# Patient Record
Sex: Male | Born: 1935 | Race: White | Hispanic: No | Marital: Married | Smoking: Never smoker
Health system: Southern US, Community
[De-identification: ages and names within clinical notes are randomized; demographics above are authoritative.]

## PROBLEM LIST (undated history)

## (undated) DIAGNOSIS — I2510 Atherosclerotic heart disease of native coronary artery without angina pectoris: Secondary | ICD-10-CM

## (undated) DIAGNOSIS — E785 Hyperlipidemia, unspecified: Secondary | ICD-10-CM

## (undated) DIAGNOSIS — N200 Calculus of kidney: Secondary | ICD-10-CM

## (undated) DIAGNOSIS — K219 Gastro-esophageal reflux disease without esophagitis: Secondary | ICD-10-CM

## (undated) DIAGNOSIS — E119 Type 2 diabetes mellitus without complications: Secondary | ICD-10-CM

## (undated) DIAGNOSIS — F419 Anxiety disorder, unspecified: Secondary | ICD-10-CM

## (undated) DIAGNOSIS — C61 Malignant neoplasm of prostate: Secondary | ICD-10-CM

## (undated) DIAGNOSIS — I10 Essential (primary) hypertension: Secondary | ICD-10-CM

## (undated) DIAGNOSIS — I509 Heart failure, unspecified: Secondary | ICD-10-CM

## (undated) DIAGNOSIS — D649 Anemia, unspecified: Secondary | ICD-10-CM

## (undated) HISTORY — DX: Heart failure, unspecified: I50.9

## (undated) HISTORY — DX: Atherosclerotic heart disease of native coronary artery without angina pectoris: I25.10

## (undated) HISTORY — DX: Anxiety disorder, unspecified: F41.9

## (undated) HISTORY — DX: Hyperlipidemia, unspecified: E78.5

## (undated) HISTORY — DX: Anemia, unspecified: D64.9

## (undated) HISTORY — DX: Malignant neoplasm of prostate: C61

## (undated) HISTORY — DX: Calculus of kidney: N20.0

## (undated) HISTORY — PX: OTHER SURGICAL HISTORY: SHX169

## (undated) HISTORY — DX: Type 2 diabetes mellitus without complications: E11.9

## (undated) HISTORY — DX: Gastro-esophageal reflux disease without esophagitis: K21.9

---

## 1992-10-12 HISTORY — PX: HERNIA REPAIR: SHX51

## 1998-08-30 ENCOUNTER — Other Ambulatory Visit: Admission: RE | Admit: 1998-08-30 | Discharge: 1998-08-30 | Payer: Self-pay | Admitting: Urology

## 1999-04-29 ENCOUNTER — Ambulatory Visit (HOSPITAL_BASED_OUTPATIENT_CLINIC_OR_DEPARTMENT_OTHER): Admission: RE | Admit: 1999-04-29 | Discharge: 1999-04-29 | Payer: Self-pay | Admitting: Urology

## 2000-10-12 HISTORY — PX: OTHER SURGICAL HISTORY: SHX169

## 2000-11-11 ENCOUNTER — Encounter: Payer: Self-pay | Admitting: Surgery

## 2000-11-12 ENCOUNTER — Encounter: Payer: Self-pay | Admitting: Surgery

## 2000-11-12 ENCOUNTER — Inpatient Hospital Stay (HOSPITAL_COMMUNITY): Admission: AD | Admit: 2000-11-12 | Discharge: 2000-11-17 | Payer: Self-pay | Admitting: Cardiology

## 2000-11-13 ENCOUNTER — Encounter: Payer: Self-pay | Admitting: Surgery

## 2000-11-14 ENCOUNTER — Encounter: Payer: Self-pay | Admitting: Cardiothoracic Surgery

## 2000-11-15 ENCOUNTER — Encounter: Payer: Self-pay | Admitting: Cardiothoracic Surgery

## 2000-11-16 ENCOUNTER — Encounter: Payer: Self-pay | Admitting: Surgery

## 2001-01-21 ENCOUNTER — Encounter: Payer: Self-pay | Admitting: Urology

## 2001-01-25 ENCOUNTER — Ambulatory Visit (HOSPITAL_COMMUNITY): Admission: RE | Admit: 2001-01-25 | Discharge: 2001-01-25 | Payer: Self-pay | Admitting: Urology

## 2002-02-26 ENCOUNTER — Inpatient Hospital Stay (HOSPITAL_COMMUNITY): Admission: EM | Admit: 2002-02-26 | Discharge: 2002-02-27 | Payer: Self-pay | Admitting: Emergency Medicine

## 2002-02-26 ENCOUNTER — Encounter: Payer: Self-pay | Admitting: Emergency Medicine

## 2002-09-18 ENCOUNTER — Encounter: Payer: Self-pay | Admitting: Urology

## 2002-09-18 ENCOUNTER — Encounter: Admission: RE | Admit: 2002-09-18 | Discharge: 2002-09-18 | Payer: Self-pay | Admitting: Urology

## 2002-10-12 HISTORY — PX: OTHER SURGICAL HISTORY: SHX169

## 2002-10-12 HISTORY — PX: TOTAL HIP ARTHROPLASTY: SHX124

## 2003-04-09 ENCOUNTER — Encounter: Admission: RE | Admit: 2003-04-09 | Discharge: 2003-04-09 | Payer: Self-pay | Admitting: Orthopedic Surgery

## 2003-04-09 ENCOUNTER — Encounter: Payer: Self-pay | Admitting: Orthopedic Surgery

## 2003-05-24 ENCOUNTER — Encounter: Payer: Self-pay | Admitting: Orthopedic Surgery

## 2003-05-30 ENCOUNTER — Inpatient Hospital Stay (HOSPITAL_COMMUNITY): Admission: RE | Admit: 2003-05-30 | Discharge: 2003-06-06 | Payer: Self-pay | Admitting: Orthopedic Surgery

## 2003-05-30 ENCOUNTER — Encounter: Payer: Self-pay | Admitting: Orthopedic Surgery

## 2003-08-30 ENCOUNTER — Observation Stay (HOSPITAL_COMMUNITY): Admission: RE | Admit: 2003-08-30 | Discharge: 2003-08-31 | Payer: Self-pay | Admitting: Orthopedic Surgery

## 2005-01-12 ENCOUNTER — Ambulatory Visit: Payer: Self-pay | Admitting: Internal Medicine

## 2006-09-07 ENCOUNTER — Ambulatory Visit: Payer: Self-pay | Admitting: Gastroenterology

## 2008-06-25 ENCOUNTER — Ambulatory Visit: Payer: Self-pay | Admitting: Internal Medicine

## 2008-06-25 ENCOUNTER — Encounter: Payer: Self-pay | Admitting: Cardiology

## 2008-06-25 ENCOUNTER — Inpatient Hospital Stay: Payer: Self-pay | Admitting: Internal Medicine

## 2008-06-25 ENCOUNTER — Ambulatory Visit: Payer: Self-pay | Admitting: Cardiovascular Disease

## 2008-07-24 ENCOUNTER — Ambulatory Visit: Payer: Self-pay | Admitting: Cardiology

## 2009-04-04 DIAGNOSIS — I2510 Atherosclerotic heart disease of native coronary artery without angina pectoris: Secondary | ICD-10-CM | POA: Insufficient documentation

## 2009-08-26 ENCOUNTER — Ambulatory Visit: Payer: Self-pay | Admitting: Gastroenterology

## 2009-12-28 ENCOUNTER — Emergency Department: Payer: Self-pay | Admitting: Emergency Medicine

## 2010-05-15 ENCOUNTER — Ambulatory Visit: Payer: Self-pay | Admitting: Family

## 2010-05-21 ENCOUNTER — Ambulatory Visit: Payer: Self-pay | Admitting: Gastroenterology

## 2010-05-23 LAB — PATHOLOGY REPORT

## 2010-07-18 ENCOUNTER — Ambulatory Visit: Payer: Self-pay | Admitting: Internal Medicine

## 2010-07-29 ENCOUNTER — Ambulatory Visit: Payer: Self-pay | Admitting: Internal Medicine

## 2010-11-03 ENCOUNTER — Encounter: Payer: Self-pay | Admitting: Cardiology

## 2010-11-04 ENCOUNTER — Telehealth: Payer: Self-pay | Admitting: Cardiology

## 2010-11-05 ENCOUNTER — Ambulatory Visit
Admission: RE | Admit: 2010-11-05 | Discharge: 2010-11-05 | Payer: Self-pay | Source: Home / Self Care | Attending: Cardiology | Admitting: Cardiology

## 2010-11-05 ENCOUNTER — Encounter: Payer: Self-pay | Admitting: Physician Assistant

## 2010-11-05 ENCOUNTER — Telehealth (INDEPENDENT_AMBULATORY_CARE_PROVIDER_SITE_OTHER): Payer: Self-pay | Admitting: *Deleted

## 2010-11-05 DIAGNOSIS — R079 Chest pain, unspecified: Secondary | ICD-10-CM | POA: Insufficient documentation

## 2010-11-05 DIAGNOSIS — I10 Essential (primary) hypertension: Secondary | ICD-10-CM | POA: Insufficient documentation

## 2010-11-05 DIAGNOSIS — R011 Cardiac murmur, unspecified: Secondary | ICD-10-CM | POA: Insufficient documentation

## 2010-11-05 DIAGNOSIS — E119 Type 2 diabetes mellitus without complications: Secondary | ICD-10-CM | POA: Insufficient documentation

## 2010-11-05 DIAGNOSIS — E785 Hyperlipidemia, unspecified: Secondary | ICD-10-CM | POA: Insufficient documentation

## 2010-11-06 ENCOUNTER — Ambulatory Visit (HOSPITAL_COMMUNITY)
Admission: RE | Admit: 2010-11-06 | Discharge: 2010-11-06 | Payer: Self-pay | Source: Home / Self Care | Attending: Cardiology | Admitting: Cardiology

## 2010-11-06 ENCOUNTER — Ambulatory Visit: Admission: RE | Admit: 2010-11-06 | Discharge: 2010-11-06 | Payer: Self-pay | Source: Home / Self Care

## 2010-11-06 ENCOUNTER — Encounter (HOSPITAL_COMMUNITY)
Admission: RE | Admit: 2010-11-06 | Discharge: 2010-11-11 | Payer: Self-pay | Source: Home / Self Care | Attending: Cardiology | Admitting: Cardiology

## 2010-11-06 ENCOUNTER — Encounter: Payer: Self-pay | Admitting: *Deleted

## 2010-11-06 ENCOUNTER — Encounter: Payer: Self-pay | Admitting: Cardiology

## 2010-11-07 ENCOUNTER — Telehealth: Payer: Self-pay | Admitting: Physician Assistant

## 2010-11-10 ENCOUNTER — Ambulatory Visit: Payer: Self-pay | Admitting: Gastroenterology

## 2010-11-11 ENCOUNTER — Ambulatory Visit: Admit: 2010-11-11 | Payer: Self-pay | Admitting: Cardiology

## 2010-11-13 NOTE — Progress Notes (Signed)
Summary: pt wants to be seen today   Phone Note Call from Patient Call back at Home Phone [phone] Call back at cell# [phone]   Caller: Spouse Reason for Call: Talk to Nurse, Talk to Doctor Summary of Call: pt is scheduled for an esaphugus stretching at [facility] reg. with Dr. Val Eagle on Monday 1/31 and his appt with Dr. Daleen Squibb is not til Tuesday and pt needs clearecne.[name] can not eat and gets choked sometimes and really wants to come in today if there is any way possible Initial call taken by: Omer Jack,  November 04, 2010 11:00 AM

## 2010-11-13 NOTE — Assessment & Plan Note (Addendum)
Summary: Cardiology Nuclear Testing  Nuclear Med Background Indications for Stress Test: Evaluation for Ischemia, Surgical Clearance, Graft Patency  Indications Comments: Pending esophagal dilation on 11/10/10 by Dr. Bluford Kaufmann [facility])  History: Angioplasty, CABG, Heart Catheterization, Myocardial Infarction, Myocardial Perfusion Study  History Comments: '89 MI>PTCA; '02 CABG; '05 EAV:[id number], EF=62%; '09 Cath:patent grafts  Symptoms: Chest Pressure, DOE  Symptoms Comments: Last episode of CP:3 weeks ago.   Nuclear Pre-Procedure Cardiac Risk Factors: Family History - CAD, Hypertension, Lipids, NIDDM Caffeine/Decaff Intake: none NPO After: 7:30 AM Lungs: Clear.  O2 Sat 96% on RA. IV 0.9% NS with Angio Cath: 22g     IV Site: R Hand IV Started by: Doyne Keel, CNMT Chest Size (in) 42     Height (in): 68 Weight (lb): 145 BMI: 22.13 Tech Comments: Carvedilol held this a.m.  Nuclear Med Study 1 or 2 day study:  1 day     Stress Test Type:  Eugenie Birks Reading MD:  Cassell Clement, MD     Referring MD:  Valera Castle, MD Resting Radionuclide:  Technetium 73m Tetrofosmin     Resting Radionuclide Dose:  10.9 mCi  Stress Radionuclide:  Technetium 59m Tetrofosmin     Stress Radionuclide Dose:  33 mCi   Stress Protocol   Lexiscan: 0.4 mg   Stress Test Technologist:  Rea [employer], CMA-N     Nuclear Technologist:  Domenic Polite, CNMT  Rest Procedure  Myocardial perfusion imaging was performed at rest 45 minutes following the intravenous administration of Technetium 40m Tetrofosmin.  Stress Procedure  The patient received IV Lexiscan 0.4 mg over 15-seconds.  Technetium 79m Tetrofosmin injected at 30-seconds.  There were no significant changes with infusion, other than occasional PVC's.  Quantitative spect images were obtained after a 45 minute delay.  QPS Raw Data Images:  Normal; no motion artifact; normal heart/lung ratio. Stress Images:  Normal homogeneous uptake in all areas of the  myocardium. Rest Images:  Normal homogeneous uptake in all areas of the myocardium. Subtraction (SDS):  No evidence of ischemia. Transient Ischemic Dilatation:  0.93  (Normal <1.22)  Lung/Heart Ratio:  0.32  (Normal <0.45)  Quantitative Gated Spect Images QGS EDV:  93 ml QGS ESV:  45 ml QGS EF:  52 %  Findings Normal nuclear study      Overall Impression  Exercise Capacity: Lexiscan with no exercise. BP Response: Normal blood pressure response. Clinical Symptoms: No chest pain ECG Impression: No significant ST segment change suggestive of ischemia. Overall Impression: Normal stress nuclear study. No wall motion abnormalities.  Appended Document: Cardiology Nuclear Testing normal ok to have his EGD  Appended Document: Cardiology Nuclear Testing Pt. notified.  Appended Document: Cardiology Nuclear Testing stable, chest discomfort probably noncardiac. OK to proceed with endoscopy and dilatation of stricture if necessary.  Reviewed Juanito Doom, MD  Appended Document: Cardiology Nuclear Testing normal, no change in treatment.

## 2010-11-13 NOTE — Assessment & Plan Note (Addendum)
Summary: pt having esophagus streghting on Monday 1/31 at  reg...  Medications Added ALPRAZOLAM 0.25 MG TABS (ALPRAZOLAM) 1 orally every 8 hours as needed ASPIRIN 325 MG TABS (ASPIRIN) 1 orally once a day CARVEDILOL 12.5 MG TABS (CARVEDILOL) orally twice a day FERROUS GLUCONATE 324 (38 FE) MG TABS (FERROUS GLUCONATE) 1 orally every other day METFORMIN HCL 500 MG TABS (METFORMIN HCL) 1 orally 2 times a day NITROSTAT 0.4 MG SUBL (NITROGLYCERIN) 1 sublingual as needed every 5 minutes times 3 doses if no relief call 911 PRILOSEC 40 MG CPDR (OMEPRAZOLE) 1 orally daily SIMVASTATIN 40 MG TABS (SIMVASTATIN) 2 a week at bedtime      Allergies Added: ! PENICILLIN ! * XRAY DYE  Visit Type:  Pre-op Evaluation   History of Present Illness: Primary Cardiologist:  Dr. Valera Castle   [name] is a 77 yo male with a history of CAD, status post CABG in 2002 who was last seen in the [place] office in 2009 after being admitted for chest pain.  A heart catheterization done at [facility] demonstrated a totally occluded LAD, circumflex and RCA.  His vein grafts were all patent and he had normal LV function (L-LAD, S-Dx, S-OM1&2, S-PDA).  He has not been seen since that time.  He called recently needing clearance to proceed with an EGD with possible esophageal dilatation.  He has been having progressively worsening dysphagia over the last several weeks.  From a cardiac standpoint, he has done well up until a couple of weeks ago.  He woke up with chest pain overnight.  It was a substernal pain.  He did take nitroglycerin with relief.  He denies any other associated symptoms.  He's been losing weight and has been having lots of diarrhea.  He's been unable to eat.  His strength is down.  However, he denies chest pain or shortness of breath with activity.  He can walk up and down steps or vacuum a room without angina.  He denies syncope.  He denies orthopnea, PND or pedal edema.  Current  Medications (verified): 1)  Alprazolam 0.25 Mg Tabs (Alprazolam) .Marland Kitchen.. 1 Orally Every 8 Hours As Needed 2)  Aspirin 325 Mg Tabs (Aspirin) .Marland Kitchen.. 1 Orally Once A Day 3)  Carvedilol 12.5 Mg Tabs (Carvedilol) .... Orally Twice A Day 4)  Ferrous Gluconate 324 (38 Fe) Mg Tabs (Ferrous Gluconate) .Marland Kitchen.. 1 Orally Every Other Day 5)  Metformin Hcl 500 Mg Tabs (Metformin Hcl) .Marland Kitchen.. 1 Orally 2 Times A Day 6)  Nitrostat 0.4 Mg Subl (Nitroglycerin) .Marland Kitchen.. 1 Sublingual As Needed Every 5 Minutes Times 3 Doses If No Relief Call 911 7)  Prilosec 40 Mg Cpdr (Omeprazole) .Marland Kitchen.. 1 Orally Daily 8)  Simvastatin 40 Mg Tabs (Simvastatin) .... 2 A Week At Bedtime  Allergies (verified): 1)  ! Penicillin 2)  ! * Xray Dye  Past History:  Past Medical History: CAD, UNSPECIFIED SITE (ICD-414.00) Diabetes Type 2 G E R D Hyperlipidemia Hypertension Anxiety Anemia - iron deficient   a. ? h/o GI bleed Nephrolithiasis Prostate CA  Past Surgical History: Cardiac bypass surgery in 2002, five vessel. Radiation seed implants for prostate cancer h/o esoph dilatation x 2 in past Left THR 2004 Angioplasty in 1989. Hernia repair in 1994.  Family History: Reviewed history from 04/04/2009 and no changes required. Family History of Diabetes:  Family History of Hypertension:  Family History of Coronary Artery Disease:   Social History: Reviewed history from 04/04/2009 and no changes required. Married  Tobacco Use - No.  Alcohol Use - no  Review of Systems       As per  the HPI.  All other systems reviewed and negative.   Vital Signs:  Patient profile:   77 year old male Height:      68 inches Weight:      145.75 pounds BMI:     22.24 Pulse rate:   76 / minute BP sitting:   130 / 70  (right arm) Cuff size:   regular  Vitals Entered By: Micki Riley CNA (November 05, 2010 9:11 AM)  Physical Exam  General:  Well nourished, well developed, in no acute distress HEENT: normal Neck: no JVD Cardiac:  normal  S1, S2; RRR; 2/6 systolic murmur at RUSB Lungs:  clear to auscultation bilaterally, no wheezing, rhonchi or rales Abd: soft, nontender, no hepatomegaly Ext: no edema Vascular: no carotid  bruits Skin: warm and dry Neuro:  CNs 2-12 intact, no focal abnormalities noted    EKG  Procedure date:  11/05/2010  Findings:      normal sinus rhythm Heart rate 66 Left axis deviation Interventricular conduction delay Left ventricular hypertrophy J-point elevation Inferior Q waves Q waves in leads V1-V3  Impression & Recommendations:  Problem # 1:  CHEST PAIN UNSPECIFIED (ICD-786.50) Etiology unclear.  I question whether or not this may be related to his esophageal stricture.  However, he says that the pain is different.  It sounds like he felt like the pain was similar to his angina.  However, he is somewhat active and can achieve 4 METS or greater without chest pain.  He's not been assessed for ischemia in over 2 years.  Dr. Daleen Squibb had recommended stress testing every 2 years in the past.  He will need to stop his aspirin for his endoscopy.  Before we clear him for his procedure, I have recommended that he proceed with a nuclear study. He has hip problems and cannot walk.  So, he will have a Lexiscan study.  We will try to arrange this as soon as possible to prevent him from rescheduling his test.  Orders: EKG w/ Interpretation (93000) Nuclear Stress Test (Nuc Stress Test)  Problem # 2:  MURMUR (ICD-785.2) Check echo.  Orders: Echocardiogram (Echo)  Problem # 3:  HYPERTENSION (ICD-401.9) Controlled.  Problem # 4:  HYPERLIPIDEMIA (ICD-272.4) Managed by PCP.  Problem # 5:  CAD, UNSPECIFIED SITE (ICD-414.00)  Continue ASA for now.  Will get Myoview as noted.  Orders: EKG w/ Interpretation (93000)  Patient Instructions: 1)  Your physician recommends that you schedule a follow-up appointment in: With Dr. Daleen Squibb in 3-4 weeks 2)  Your physician has requested that you have an  echocardiogram. Needs to be done this week for Endoscopy that is scheduled for Monday. Echocardiography is a painless test that uses sound waves to create images of your heart. It provides your doctor with information about the size and shape of your heart and how well your heart's chambers and valves are working.  This procedure takes approximately one hour. There are no restrictions for this procedure. 3)  Your physician has requested that you have a Lexiscan this week along with Echo.  For further information please visit [email or web address]  Please follow instruction sheet, as given.

## 2010-11-13 NOTE — Progress Notes (Addendum)
   Phone Note Outgoing Call    Follow-up for Phone Call        Discussed with Tereso Newcomer, PA who states pt may stop ASA 5 days prior to GI procedure. He should resume after procedure when OK from GI standpoint.  April Webster at [facility] notified and echo, office note, stress test and this note faxed to office Follow-up by: Dossie Arbour, RN, BSN,  November 07, 2010 4:05 PM     Appended Document:   Reviewed Juanito Doom, MD

## 2010-11-13 NOTE — Letter (Signed)
Summary: [facility]   Imported By: Marylou Mccoy 11/03/2010 14:22:18  _____________________________________________________________________  External Attachment:    Type:   Image     Comment:   External Document

## 2010-11-13 NOTE — Progress Notes (Signed)
Summary: Nuclear Pre-Procedure  Phone Note Outgoing Call Call back at [facility] Phone [phone]   Call placed by: Stanton Kidney, EMT-P,  November 05, 2010 2:49 PM Call placed to: Patient Action Taken: Phone Call Completed Summary of Call: Reviewed information on Myoview Information Sheet (see scanned document for further details).  Spoke with the patient. Stanton Kidney, EMT-P  November 05, 2010 2:49 PM     Nuclear Med Background Indications for Stress Test: Evaluation for Ischemia, Surgical Clearance, Graft Patency  Indications Comments: Pending esoph. dilation  History: Angioplasty, CABG, Heart Catheterization, Myocardial Infarction  History Comments: '89 MI > angioplasty '02 CABG x5 '09 Heart Cath: patent grafts  Symptoms: Chest Pain    Nuclear Pre-Procedure Cardiac Risk Factors: Family History - CAD, Hypertension, Lipids, NIDDM Height (in): 68

## 2010-11-26 ENCOUNTER — Encounter: Payer: Self-pay | Admitting: Cardiology

## 2010-11-26 ENCOUNTER — Ambulatory Visit (INDEPENDENT_AMBULATORY_CARE_PROVIDER_SITE_OTHER): Payer: Medicare Other | Admitting: Cardiology

## 2010-11-26 DIAGNOSIS — I359 Nonrheumatic aortic valve disorder, unspecified: Secondary | ICD-10-CM

## 2010-11-26 DIAGNOSIS — I2510 Atherosclerotic heart disease of native coronary artery without angina pectoris: Secondary | ICD-10-CM

## 2010-12-03 NOTE — Assessment & Plan Note (Signed)
Summary: 2-4 wks  Medications Added FLORASTOR 250 MG CAPS (SACCHAROMYCES BOULARDII) Take 1 tablet by mouth once a day. IMODIUM A-D 2 MG TABS (LOPERAMIDE HCL) as needed      Allergies Added:   Visit Type:  follow-up 2-3 weeks Primary Provider:  Dr. Judithann Sheen... [facility]  CC:  pt feels like energy level is low. pt has no other complaints today.  History of Present Illness: [name] returns tday for his history of CAD and mild AS. He has had no further CP. He had an uneventual esophageal dilatation. We clear him with a stable Myoview and Echo.  Clinical Reports Reviewed:  Nuclear Study:  11/06/2010:   Overall Impression   Exercise Capacity: Lexiscan with no exercise. BP Response: Normal blood pressure response. Clinical Symptoms: No chest pain ECG Impression: No significant ST segment change suggestive of ischemia. Overall Impression: Normal stress nuclear study. No [name] motion abnormalities.   Current Medications (verified): 1)  Alprazolam 0.25 Mg Tabs (Alprazolam) .Marland Kitchen.. 1 Orally Every 8 Hours As Needed 2)  Aspirin 325 Mg Tabs (Aspirin) .Marland Kitchen.. 1 Orally Once A Day 3)  Carvedilol 12.5 Mg Tabs (Carvedilol) .... Orally Twice A Day 4)  Ferrous Gluconate 324 (38 Fe) Mg Tabs (Ferrous Gluconate) .Marland Kitchen.. 1 Orally Every Other Day 5)  Metformin Hcl 500 Mg Tabs (Metformin Hcl) .Marland Kitchen.. 1 Orally 2 Times A Day 6)  Nitrostat 0.4 Mg Subl (Nitroglycerin) .Marland Kitchen.. 1 Sublingual As Needed Every 5 Minutes Times 3 Doses If No Relief Call 911 7)  Prilosec 40 Mg Cpdr (Omeprazole) .Marland Kitchen.. 1 Orally Daily 8)  Simvastatin 40 Mg Tabs (Simvastatin) .... 2 A Week At Bedtime 9)  Florastor 250 Mg Caps (Saccharomyces Boulardii) .... Take 1 Tablet By Mouth Once A Day. 10)  Imodium A-D 2 Mg Tabs (Loperamide Hcl) .... As Needed  Allergies (verified): 1)  ! Penicillin 2)  ! * Xray Dye  Past History:  Past Medical History: Last updated: 11/05/2010 CAD, UNSPECIFIED SITE (ICD-414.00) Diabetes Type 2 G E R  D Hyperlipidemia Hypertension Anxiety Anemia - iron deficient   a. ? h/o GI bleed Nephrolithiasis Prostate CA  Past Surgical History: Last updated: 11/05/2010 Cardiac bypass surgery in 2002, five vessel. Radiation seed implants for prostate cancer h/o esoph dilatation x 2 in past Left THR 2004 Angioplasty in 1989. Hernia repair in 1994.  Family History: Last updated: 04/04/2009 Family History of Diabetes:  Family History of Hypertension:  Family History of Coronary Artery Disease:   Social History: Last updated: 04/04/2009 Married  Tobacco Use - No.  Alcohol Use - no  Risk Factors: Smoking Status: never (04/04/2009)  Review of Systems       negative other than HPI  Vital Signs:  Patient profile:   77 year old male Height:      68 inches Weight:      148.50 pounds BMI:     22.66 Pulse rate:   78 / minute Resp:     18 per minute BP sitting:   102 / 62  (left arm) Cuff size:   large  Vitals Entered By: Celestia Khat, CMA (November 26, 2010 10:40 AM)  Physical Exam  General:  Well developed, well nourished, in no acute distress. Head:  normocephalic and atraumatic Eyes:  PERRLA/EOM intact; conjunctiva and lids normal. Neck:  Neck supple, no JVD. No masses, thyromegaly or abnormal cervical nodes. Chest [name]:  no deformities or breast masses noted Lungs:  Clear bilaterally to auscultation and percussion. Heart:  RRR, Ni S1 and  S2, SOFT SM with normal S2 split, no carotid bruits Msk:  decreased ROM.   Pulses:  diminished but present Extremities:  No clubbing or cyanosis. Neurologic:  Alert and oriented x 3. Skin:  Intact without lesions or rashes. Psych:  Normal affect.   Impression & Recommendations:  Problem # 1:  MURMUR (ICD-785.2) Assessment Unchanged Mild AS, anticipate no intervention in future, discussed with patient and wife. His updated medication list for this problem includes:    Carvedilol 12.5 Mg Tabs (Carvedilol) ..... Orally twice a  day    Nitrostat 0.4 Mg Subl (Nitroglycerin) .Marland Kitchen... 1 sublingual as needed every 5 minutes times 3 doses if no relief call 911  Problem # 2:  CHEST PAIN UNSPECIFIED (ICD-786.50) Assessment: Improved  His updated medication list for this problem includes:    Aspirin 325 Mg Tabs (Aspirin) .Marland Kitchen... 1 orally once a day    Carvedilol 12.5 Mg Tabs (Carvedilol) ..... Orally twice a day    Nitrostat 0.4 Mg Subl (Nitroglycerin) .Marland Kitchen... 1 sublingual as needed every 5 minutes times 3 doses if no relief call 911  Problem # 3:  CAD, UNSPECIFIED SITE (ICD-414.00) Assessment: Unchanged  His updated medication list for this problem includes:    Aspirin 325 Mg Tabs (Aspirin) .Marland Kitchen... 1 orally once a day    Carvedilol 12.5 Mg Tabs (Carvedilol) ..... Orally twice a day    Nitrostat 0.4 Mg Subl (Nitroglycerin) .Marland Kitchen... 1 sublingual as needed every 5 minutes times 3 doses if no relief call 911  Patient Instructions: 1)  Your physician recommends that you schedule a follow-up appointment in: 1 year with Dr. Daleen Squibb 2)  Your physician recommends that you continue on your current medications as directed. Please refer to the Current Medication list given to you today.

## 2011-02-04 ENCOUNTER — Inpatient Hospital Stay: Payer: Self-pay | Admitting: Internal Medicine

## 2011-02-05 DIAGNOSIS — R5383 Other fatigue: Secondary | ICD-10-CM

## 2011-02-05 DIAGNOSIS — R5381 Other malaise: Secondary | ICD-10-CM

## 2011-02-24 NOTE — Assessment & Plan Note (Signed)
[facility] OFFICE NOTE   AERION, BAGDASARIAN                        MRN:          [id number]  DATE:07/24/2008                            DOB:          Jan 06, 1926    Mr. Gohman returns today for further management of his coronary artery  disease.  He was seen in my absence by Dr. Berton Mount with, what was  felt to be, potentially unstable angina.  He was admitted at [facility], ruled out for myocardial infarction.   Catheterization by Dr. Earney Hamburg on June 26, 2008, showed 100%  occlusion of his right coronary artery, left circumflex, and his LAD.  All 5 grafts were patent including a left internal mammary graft to the  LAD, vein graft to a diagonal, vein graft to an obtuse marginal 1 and 2,  and a vein graft to a right coronary PDA.  He had normal left  ventricular systolic function.   He has no complaints today, except generalized fatigue.   His medications are outlined in the previous note.  He is on a nitro  patch 0.02 mg daily.   PHYSICAL EXAMINATION:  VITAL SIGNS:  His blood pressure is 122/62, his  pulse is 71 and regular, and his weight is 167.  LUNGS:  Respiratory exam is unchanged.  HEART:  His cath site is stable.  EXTREMITIES:  His pulses are intact in his lower extremities.   EKG shows a sinus rhythm with left axis deviation and Q's in III and aVF  which are old.   ASSESSMENT AND PLAN:  Mr. Pilley is doing well.  I am delighted his  grafts were opened after this number of years, particularly with his  diabetes.  I have advised him to see [place] once a year and have a stress  Myoview every 2 years for surveillance.  We will stop his nitroglycerin  patch.     Thomas C. Daleen Squibb, MD, [facility]  Electronically Signed    TCW/MedQ  DD: 07/24/2008  DT: 07/25/2008  Job #: [id number]   cc:   Aram Beecham

## 2011-02-27 NOTE — Op Note (Signed)
[name]  Patient:    [name], [name]                        MRN: [id number] Proc. Date: 01/25/01 Adm. Date:  [id number] Attending:  Nelma Rothman Iii                           Operative Report  PREOPERATIVE DIAGNOSES: 1. Right ureteral lithiasis. 2. Hematuria.  POSTOPERATIVE DIAGNOSES: 1. Right ureteral lithiasis. 2. Hematuria.  OPERATION: 1. Cystourethroscopy. 2. Bilateral retrograde ureteral pyelograms. 3. Right ureteroscopy with holmium laser lithotripsy. 4. Placement of right JJ stent.  SURGEON:  Lucrezia Starch. Ovidio Hanger, M.D.  ANESTHESIA:  General endotracheal anesthesia.  ESTIMATED BLOOD LOSS:  Negligible.  TUBES:  26 cm 6-French Surgitek double pigtail stent.  COMPLICATIONS:  None.  INDICATIONS FOR PROCEDURE:  [name] is a very nice 77 year old white male who presented with hematuria and flank pain.  He has a history of prostate cancer, has undergone an orchiectomy and has had seed implantation previously but has a stable PSA of 0.17.  He subsequently, on renal ultrasound, was found to have a moderate right hydronephrosis, and left intrarenal calculi was noted. He is allergic to IVP DYE.  After understanding all the risks, benefits, and alternatives and elected to proceed with cystoscopy, retrograde ureteral pyelogram, ureteroscopy, etc.  PROCEDURE IN DETAIL:  The patient was placed in the supine position.  After proper general endotracheal anesthesia, he was placed in the dorsolithotomy position and prepped and draped with Betadine in a sterile fashion.  A cystourethroscopy was performed with a 22.5-French Olympus panendoscope utilizing the 12 and 70 degree lenses.  The bladder was carefully inspected. Efflux of clear urine was noted from the left ureteral orifice.  The right ureteral orifice was somewhat horseshoe, but it was in its orthotopic position.  There was moderate trilobar hypertrophy, and grade I trabeculation was  noted.  Under fluoroscopic guidance, bilateral retrograde ureteral pyelograms were performed with a 6-French open-ended catheter.  The left ureteral calyceal system were without obstruction, and no obvious filling defects on the right side at the level of the vessels.  At the junction of the middle and lower third ureter, there was a filling defect with obstruction consistent with calculus.  Under fluoroscopic guidance, a 0.038 French Teflon-coated guidewire was placed in the right renal pelvis and bypassed the stone.  The lower ureter was balloon dilated under fluoroscopic vision with a 10 cm, 4 mm, 20 atmosphere burst pressure balloon to 10 atmospheres of pressure for 3 minutes.  This was moved out, and there was no wasting noted.  The balloon dilator and, under direct vision utilizing a 6-French ACMI ureteroscope, the lower ureter was negotiated visually, and the stone was visualized and noted to be somewhat impacted.  Utilizing the 365 laser fiber with holmium laser, laser lithotripsy was then performed, and the stone was broken into multiple fragments.  Each fragment was grasped wit a nitinol basket and extracted piece by piece. Reinspection of the lower two-thirds ureter revealed that there were no fragments remaining.  There were no perforations or injuries, and the ureter was widely patent.  The stone will be submitted for stone analysis.  Under fluoroscopic vision, a 26 cm 6-French Surgitek double pigtail stent wsas placed and noted to be in good position in the right renal pelvis within the bladder.  Pull-out string was attached.  The bladder was  drained. Panendoscopes were removed.  The string was taped to the penis, and the patient was taken to the recovery room in stable condition. DD:  01/25/01 TD:  01/25/01 Job: [id number]

## 2011-02-27 NOTE — H&P (Signed)
[place]. [facility]  Patient:    [name], [name] Visit Number: [id number] MRN: [id number]          Service Type: MED Location: [phone] Attending Physician:  Learta Codding Dictated by:   Lewayne Bunting, M.D. [facility] Admit Date:  02/26/2002   CC:         Thomas C. Wall, M.D. [facility]  Dr. Judithann Sheen, [place], [place]   History and Physical  CHIEF COMPLAINT:  Substernal chest pain lasting several hours.  HISTORY OF PRESENT ILLNESS:  Mr. [name] is a 77 year old, white male with a past medical history of coronary artery disease, status post CABG in January 2002.  The patient had known multivessel coronary artery disease.  He underwent successful operation by Dr. Laneta Simmers.  His ejection fraction was 45-50%.  Since then, he has been doing well.  More recently, he underwent a stress test in the office by Dr. Daleen Squibb and this reportedly was within normal limits.  Overall, he states that he has been feeling extremely well.  He has had no recurrence of substernal chest pain, orthopnea or PND.  Recently, he was treated for poison oak with steroids and has recently just discontinued. This morning, prior to going to church, he suddenly felt the onset of 1/2 substernal chest pain with no shortness of breath or diaphoresis at that time. He then reported that this pain lasted approximately a few hours and had finally decided, at the request of his family, to take a nitroglycerin.  This did resolve the pain in approximately 10 minutes.  He became slightly diaphoretic after he took nitroglycerin, but was pain free at that time.  The patient was then brought to the emergency room for further evaluation in particularly because of the familys request.  In the ER, he is pain free.  He has no acute EKG changes.  He has no shortness of breath.  He has no recent palpitations or syncope.  ALLERGIES:  Questionable cough secondary to ACE INHIBITORS.  Questionable allergy to ARB.  PENICILLIN causes  rash.  IVP DYE.  MEDICATIONS: 1. Coreg 12.5 mg p.o. b.i.d. 2. Folic acid one tablet p.o. q.d. 3. Lipitor 5 mg p.o. q.d. 4. Enteric coated aspirin 325 mg p.o. q.d. 5. Zantac 150 mg p.o. q.d. 6. Allopurinol 300 mg one pill every week. 7. Multivitamin. 8. Iron sulfate.  PAST MEDICAL HISTORY: 1. Coronary artery disease, status post anterior wall MI in 1989, at [facility]. 2. Status post CABG in 2002, multivessel CAD with EF 45-50%.  Recent    Cardiolite negative as outlined above. 3. Prostate cancer, status post brachytherapy with orchiectomy. 4. Diabetes mellitus on metformin. 5. Status post right internal hernia repair in 1994. 6. History of parathyroidectomy x2. 7. History of kidney stones. 8. Gout.  SOCIAL HISTORY:  The patient lives with his wife in [place].  He has two children.  He does not smoke or drink.  FAMILY HISTORY:  Notable for coronary disease in mother.  Father has hypertension at age 35.  Brother had MI at age 52.  REVIEW OF SYSTEMS:  CONSTITUTIONAL: No fevers or chills.  HEENT: No headache or sore throat.  RESPIRATORY: No chest pain, shortness of breath, dyspnea on exertion.  GU: No frequency or dysuria.  MUSCULOSKELETAL: No weakness or numbness.  No myalgias or arthralgias.  GI: No nausea or vomiting, polyuria or polydipsia.  PHYSICAL EXAMINATION:  VITAL SIGNS:  Blood pressure 147/74, heart rate 68 beats per minute, respiratory rate 18, temperature 97.6.  GENERAL:  This is a pleasant, white male in no apparent distress.  HEENT:  NCAT.  PERRLA.  EOMI.  No JVD.  No carotid bruits.  HEART:  Regular rate and rhythm with normal S1, S2, status post median sternotomy.  LUNGS:  Clear.  SKIN:  No rash or lesions.  ABDOMEN:  Soft, nontender.  GU/RECTAL:  Deferred.  EXTREMITIES:  No clubbing, cyanosis, or edema.  Palpable peripheral pulses throughout.  NEUROLOGIC:  Alert and oriented.  LABORATORY DATA AND X-RAY FINDINGS:  Chest x-ray with no acute  changes.  EKG with heart rate 68 beats per minute and normal sinus rhythm with no acute ischemic changes.  White count 11.9, hemoglobin 11.6, platelet count 313.  CK 45, MB 20, troponin 0.01.  INR 0.9.  Sodium 138, potassium 4.1, BUN 21, creatinine 1.0.  The remainder of labs are within normal limits.  IMPRESSION: 1. Substernal chest pain, somewhat atypical lasting for several hours, no    acute ischemic changes.  Overall, the patient is at low risk.  We will do    a multimarker assessment strategy.  If all the markers are within normal    limits, the patient can undergo an outpatient Cardiolite stress test.    Medical therapy was intensified with increase in Coreg and the addition of    long-acting nitrates to his medical regimen.  Unless the patient has    recurrent substernal chest pain, I do not think cardiac catheterization is    indicated at this time. 2. Diabetes mellitus.  Sliding scale will be provided. 3. Intravenous pyelogram dye allergy. 4. History of prostate cancer, status post brachytherapy, stable.  PLAN:  The patient will be admitted and ruled out overnight.  He will likely be discharged tomorrow after stress test. Dictated by:   Lewayne Bunting, M.D. [facility] Attending Physician:  Learta Codding DD:  02/26/02 TD:  02/26/02 Job: [id number]

## 2011-02-27 NOTE — Op Note (Signed)
NAME:  [name], [name]                           ACCOUNT NO.:  [id number]   MEDICAL RECORD NO.:  [id number]                   PATIENT TYPE:  OBV   LOCATION:  0479                                 FACILITY:  [facility]   PHYSICIAN:  Georges Lynch. Gioffre, M.D.             DATE OF BIRTH:  1926/08/09   DATE OF PROCEDURE:  08/30/2003  DATE OF DISCHARGE:                                 OPERATIVE REPORT   PREOPERATIVE DIAGNOSIS:  Complete retracted tear of the left rotator cuff  tendon.   POSTOPERATIVE DIAGNOSIS:  Complete retracted tear of the left rotator cuff  tendon.   OPERATION:  1. Partial acromionectomy and acromioplasty on the left.  2. Repair of a complete retracted rotator cuff tendon tear utilizing the     graft jacket.  3. Multi-Tack anchors.   SURGEON:  Georges Lynch. Darrelyn Hillock, M.D.   ASSISTANT:  Ebbie Ridge. Paitsel, P.A.   DESCRIPTION OF PROCEDURE:  Under general anesthesia, the patient first had  an interscalene block, then a general anesthetic.  Sterile prep and draping  of the left shoulder was carried out.  He had 1 g of IV Ancef.  An incision  was made over the anterior aspect of the left shoulder, and bleeders were  identified and cauterized.  At this time the deltoid tendon was stripped by  sharp dissection from the acromion.  It was noted at this time that the  acromion was markedly thickened, downsloped, and literally embedded into the  bone.  There was no rotator cuff present.  We then took the oscillating saw  and a bur and did a partial acrominonectomy and acromioplasty, and now we  had a better vision of whatever remaining cuff that he had.  We went back in  with the Kocher clamps and brought whatever tendon we could forward and then  sutured a 5 x 5 double-thickness graft jacket to the remaining tendon.  Note  we had very little tendon to utilize laterally.  We first burred down the  lateral articular surface of the humerus, removed all the spurs so that we  could get a good  bleeding bony bed.  We also utilized three Multi-Tack  sutures in the proximal humerus to suture down the distal part of the tendon  repair.  I thoroughly irrigated out the area.  I bone-waxed the undersurface  of the acromion and then inserted thrombin-soaked Gelfoam back into the  subacromial space for hemostasis purposes.  The deltoid tendon and muscle  then were reapproximated in the usual fashion.  The subcu was closed with 0  Vicryl.  The skin was closed with metal staples and a sterile Neosporin  dressing was applied, and he was placed in a shoulder immobilizer.  Ronald A. Darrelyn Hillock, M.D.    RAG/MEDQ  D:  08/30/2003  T:  08/30/2003  Job:  [id number]

## 2011-02-27 NOTE — Cardiovascular Report (Signed)
[place]. [facility]  Patient:    [name], [name]                        MRN: [id number] Adm. Date:  [id number] Attending:  Learta Codding CC:         Aletha Halim, M.D.  Thomas C. Wall, M.D. [facility]   Cardiac Catheterization  DATE OF BIRTH:  Oct 30, 1925.  REFERRING PHYSICIAN:  Aletha Halim, M.D.  CARDIOLOGIST:  Jesse Sans. Wall, M.D. [facility]  PROCEDURES PERFORMED: 1. Left heart catheterization. 2. Ventriculography.  DIAGNOSES: 1. Multi-vessel coronary artery disease. 2. Mild left ventricular systolic dysfunction. 3. Evidence for viability in the inferior wall by ventriculography.  HISTORY:  Mr. [name] is a 77 year old white male patient of Dr. Daleen Squibb.  The patient has a prior inferior wall myocardial infarction, 1989, at [facility] and is status post PTCA therapy.  However, the patient had restenosis with complete occlusion 6 months after his original procedure.  The patient now presents to Dr. Daleen Squibb with increased chest pain and shortness of breath.  He has known IVP dye allergy and has been adequately pretreated.  He has been referred for diagnostic catheterization to assess his coronary anatomy.  Of note was that the patient underwent a recent stress test which showed inferior wall hyperperfusion with no evidence for redistribution.  Ejection fraction was 40-45%.  DESCRIPTION OF PROCEDURE:  After informed consent was obtained, the patient was brought to the catheterization laboratory.  The right groin was sterilely prepped and draped.  Lidocaine, 1%, was used to infiltrate the right groin.  A 6-French arterial sheath was placed using the modified Seldinger technique. Subsequently, 6-French JL4 and JR4 catheters were used to engage the left and right coronary arteries.  Selective angiography was performed in various projections using manual injections of contrast.  After selective coronary angiography, ventriculography was performed in single-plane RAO  projection. Appropriate left-sided hemodynamics were obtained.  Ventriculography was performed using power injections of contrast.  Subsequently, the pigtail catheter was removed.  At the termination of the case, the catheters and sheath were removed, and manual pressure was applied until adequate hemostasis was achieved.  The patient tolerated the procedure well and was transferred to the floor in stable condition.  FINDINGS:  Hemodynamics:  Left ventricular pressure 148/28 mmHg.  Aortic pressure 148/70 mmHg.  Ventriculography:  Ejection fraction is 45-50% with inferobasal akinesis and mild hypokinesis of the remaining distal inferior wall.  There is no mitral regurgitation noted.  Selective coronary angiography: 1. The left main coronary artery is a large caliber vessel with no evidence of    flow-limiting coronary artery disease. 2. The left anterior descending artery is a large caliber vessel wrapping    around the apex.  There is diffuse stenosis in the mid segment of the LAD    approximately 40-50%.  There is a more distal focal lesion of approximately    70%.  There is a small first diagonal branch free of flow-limiting    coronary artery disease.  The second diagonal branch is a moderate caliber    vessel with approximately 90% ostial stenosis.  Of note is that both the    left main coronary artery and the proximal part of the left anterior    descending artery are calcified. 3. The circumflex coronary artery has a diffuse 90% proximal stenosis.  This    is a complex lesion involving the ostium of the first obtuse marginal  branch.  There is a 90% proximal obtuse marginal branch stenosis.  The    distal circumflex coronary artery has approximately a 70% stenosis. 4. The right coronary artery is completely occluded in its proximal segment. 5. Collateral vessel are visualized from the distal LAD to the posterior    descending artery and a larger collateral system from the  circumflex    coronary artery to the posterolateral branches of the right coronary    artery.  CONCLUSIONS: 1. Multi-vessel coronary artery disease with complete occlusion of the right    coronary artery and high-grade stenosis of the circumflex coronary artery    as well as significant disease in the left anterior descending artery    distribution. 2. Mild LV systolic dysfunction. 3. Inferobasal akinesis and distal inferior hypokinesis. 4. Markedly elevated left ventricular end diastolic pressure.  RECOMMENDATIONS:  The results have been discussed with the patient.  Dr. Daleen Squibb has been notified.  Angiographic images were reviewed with Dr. Juanda Chance, and the decision was made to proceed with CVTS consultation as the patient would be best served in the long run with complete coronary revascularization, particularly in light of the significant viability in the inferior wall. DD:  11/11/00 TD:  11/11/00 Job: [id number]

## 2011-02-27 NOTE — Discharge Summary (Signed)
. [facility]  Patient:    [name], [name]                        MRN: [id number] Adm. Date:  [id number] Disc. Date: [id number] Attending:  Cleatrice Burke Dictator:   Carlye Grippe. CC:         Jesse Sans. Daleen Squibb, M.D. [facility]  Tinnie Gens D. Judithann Sheen, M.D., [facility], [place], [place]   Discharge Summary  HISTORY OF PRESENT ILLNESS:  This is a 77 year old male, a patient of Thomas C. Wall, M.D., who has a history of inferior myocardial infarction with failed angioplasty attempt in February of 1989 at [facility]. Since that time, he has been stable.  He had a stress Cardiolite in September of 1999 which was negative and adequate with a hypertensive blood pressure response.  The ejection fraction was 57% with a small area of inferolateral wall infarction.  Over the past several months, he has had recurrent episodes of chest tightness and fullness radiating to the shoulders, particularly when lying down.  Also, he has been having increased shortness of breath with exertion.  A stress Cardiolite was done in [place], [place], in which he exercised for a total of 5 minutes and 30 seconds, receiving an MET level of 7.0.  The maximum heart rate was 110, which was only 74% predicted. He also had dyspnea and fatigue.  The scan demonstrated inferior wall hypoperfusion with no significant redistribution.  There were no other significant areas of ischemia or concern.  The ejection fraction was 45%.  He continued to have problems and represented to [place] C. Wall, M.D.  He subsequently has undergone cardiac catheterization which showed normal left main coronary artery, although it did have some calcium in it.  The LAD had a 40-50% mid vessel disease and a 70% distal stenosis.  There was a 90% second diagonal stenosis.  The left circumflex had 90% proximal stenosis and 90% first marginal stenosis.  There was about a 70% distal stenosis  before a third small marginal branch.  The right coronary artery had complete occlusion proximally with faint left-to-right collaterals.  The left ventricular ejection fraction was 45-50%.  The patient was subsequently referred to Alleen Borne, M.D., for cardiac surgical opinion and he agreed with recommendations for proceeding with coronary artery bypass grafting as his best revascularization option.  PROCEDURE:  On November 13, 2000, the patient underwent the following procedure:  Coronary artery bypass grafting x 5.  The following grafts were placed:  1. Left internal mammary artery to the LAD.  2. Saphenous vein graft to obtuse marginal #1 and obtuse marginal #2 in a sequential fashion. 3. Saphenous vein graft to the diagonal.  4. Saphenous vein graft to the posterior descending.  The cross clamp time was 59 minutes and pump time 101 minutes.  The patient tolerated the procedure well and was taken to the surgical intensive care unit in stable condition.  POSTOPERATIVE HOSPITAL COURSE:  The patient has done well.  He was extubated without difficulty.  He began a course of gentle diuresis.  All routine lines, monitors, and tubes were discontinued in a stepwise fashion without difficulty.  His blood pressure did have some elevation, but was stabilized on medication.  His incisions are healing well without signs of infection.  He is tolerating routine cardiac rehabilitation phase I modalities.  His rhythm is stable and he has had no significant cardiac dysrhythmias or  ectopy.  He is afebrile.  Oxygen has been weaned and he maintains good saturations on room air.  Overall the patient is felt to be quite stable for tentative discharge in the morning of November 17, 2000, pending morning round re-evaluation.  MEDICATIONS ON DISCHARGE: 1. Enteric-coated aspirin 325 mg q.d. 2. Tylox one to two q.4-6h. p.r.n. 3. Mavik 2 mg daily. 4. Coreg 12.5 mg daily. 5. Lipitor 10 mg daily. 6.  Allopurinol 300 mg q.d. 7. Folic acid 1 mg q.d. 8. Zantac p.r.n. 9. Centrum Silver one p.o. q.d.  FOLLOW-UP:  Will include Alleen Borne, M.D., in three weeks and Jesse Sans. Wall, M.D., in two weeks.  FINAL DIAGNOSES: 1. Severe coronary artery disease as described above. 2. History of prostate cancer with seed implant and orchiectomy. 3. History of glucose intolerance. 4. History of right inguinal hernia repair. 5. History of parathyroidectomy x 2.  DISCHARGE INSTRUCTIONS:  The patient received written instructions regarding medications, activity, diet, wound care, and follow-up. DD:  11/16/00 TD:  11/17/00 Job: [id number]

## 2011-02-27 NOTE — Op Note (Signed)
NAME:  [name]                           ACCOUNT NO.:  [id number]   MEDICAL RECORD NO.:  [id number]                   PATIENT TYPE:  INP   LOCATION:  X004                                 FACILITY:  [facility]   PHYSICIAN:  Georges Lynch. Darrelyn Dennis, M.D.             DATE OF BIRTH:  1926/04/15   DATE OF PROCEDURE:  05/30/2003  DATE OF DISCHARGE:                                 OPERATIVE REPORT   SURGEON:  Georges Lynch. Darrelyn Dennis, M.D.   ASSISTANT:  Ronnell Guadalajara, M.D.   PREOPERATIVE DIAGNOSIS:  Severe degenerative arthritis of the left hip.   POSTOPERATIVE DIAGNOSIS:  Severe degenerative arthritis of the left hip.   OPERATION/PROCEDURE:  Left total hip arthroplasty utilizing the [employer]  system.  The sizes used were as follows:  The cup was a 54 mm PSL micro  structured cup with a 10-degree series to insert.  The femoral component was  a size 11 Secur-Fit Plus stem.  I utilized one cancellous bone screw and the  C-tapered head was 32 mm in diameter, Plus 0.   DESCRIPTION OF PROCEDURE:  Under spinal anesthesia and routine orthopedic  prep and draping of the left hip was carried out.  A posterolateral approach  to the hip was carried, bleeders identified and cauterized.  At this time  the self-retaining retractors were inserted.  The incision was carried down  through the iliotibial band.  I identified the external rotators, protected  the sciatic nerve and then incised the rotators in the usual fashion.  I  then did a capsulectomy, once again great care taken to protect the sciatic  nerve.  I tagged the piriformis tendon and reflected it out of harm's way.  Following this, I then dislocated the femoral head and then amputated the  head with the oscillating saw at the appropriate neck level.  At this time I  inserted a guide pin and we reamed and rasped the femoral shaft up to a size  11 Secur-Fit sten, thoroughly irrigated out the area and we drilled our  distal tip to 14.5 mm.  Following  this, we then prepared the acetabulum.  I  completed the capsulectomy and then reamed the acetabulum up to a size 54 mm  diameter.  I then inserted my permanent cup and then prior to fixing the  cup, we went through trials with the trial liners, etc., and we had  excellent position.  I then completed the insertion of the acetabular shell.  Then went on and inserted one good cancellous bone screw.  We had an  excellent fixation of the cup.  Only one screw was necessary.  Following  this, I then went on and inserted my permanent 10-degree liner at the  appropriate angle.  Following this, I then inserted my size 11 Secur-Fit  femoral component. We had an excellent fixation.  We went through trials  once again with a minus  5 and a plus 0 C-tapered head, 32 mm in diameter.  We felt that the most stable was a 32 mm, plus 0, C-tapered head.  We then  reduced it into the acetabulum after inserting the permanent head and had  excellent function and good stability.  Thoroughly irrigated out the area.  I inserted a Hemovac drain and closed the wound in layers in the usual  fashion.  Skin was closed with metal staples and a sterile Neosporin  dressing was applied.  The patient has 1 g of IV Ancef preoperatively.                                               [name], M.D.   RAG/MEDQ  D:  05/30/2003  T:  05/30/2003  Job:  [id number]   cc:   Thomas C. Wall, M.D.

## 2011-02-27 NOTE — Discharge Summary (Signed)
NAME:  [name], [name]                           ACCOUNT NO.:  [id number]   MEDICAL RECORD NO.:  [id number]                   PATIENT TYPE:  INP   LOCATION:  0471                                 FACILITY:  [facility]   PHYSICIAN:  Georges Lynch. Darrelyn Hillock, M.D.             DATE OF BIRTH:  November 12, 1925   DATE OF ADMISSION:  05/30/2003  DATE OF DISCHARGE:                                 DISCHARGE SUMMARY   ADMISSION DIAGNOSES:  1. Osteoarthritis right hip.  2. Hypertension.  3. Previous myocardial infarction 1989.  4. Gastroesophageal reflux disease.  5. Prostate cancer.  6. Kidney stones.   DISCHARGE DIAGNOSES:  1. Osteoarthritis left hip status post left total hip arthroplasty.  2. Hypertension.  3. History of myocardial infarction 1999.  4. Gastroesophageal reflux disease.  5. Prostate cancer.  6. Kidney stones.   PROCEDURE:  The patient was taken to the operating room on May 30, 2003  to undergo a left total hip arthroplasty.  Surgeon Georges Lynch. Darrelyn Hillock, M.D.  Assistant Ronnell Guadalajara, M.D.  Surgery was performed under spinal  anesthesia.  A Hemovac drain was placed at the time of surgery.   CONSULTS:  Physical therapy, occupational therapy.   BRIEF HISTORY:  This patient is a 77 year old male who has had left hip pain  for greater than one year.  The patient walks with a limp.  Is required to  ambulate with a cane due to the severity of the same.  Pain has been  progressing over the past several months.  MRI of his left hip in January  revealed severe degenerative changes and the patient has elected to proceed  with a left total hip arthroplasty.   LABORATORY DATA:  Pre admission CBC:  WBC 6.1, hemoglobin 12.6, hematocrit  34.6, platelet count 316,000.  Pre admission PT 12.6, INR 0.9, PTT 37.  Pre  admission chemistries:  Sodium 141, potassium 4.5, chloride 110, glucose 135  (slightly elevated), BUN 22, creatinine 1.2, albumin 3.6.  Pre admission  urinalysis negative.  The  patient's blood type is O-.  Negative antibody  screen.  Serial hemoglobin and hematocrit were followed throughout the  hospitalization.  The patient had a postoperative drop in his hemoglobin to  9.5, hematocrit 27.8.  Stabilized prior to transfer at last hemoglobin on  June 04, 2003 9.8, hematocrit 29.  Preoperative x-ray of left hip revealed  severe degenerative changes with loss of the joint space.  Postoperative x-  ray of the left hip revealed anatomic alignment of left total hip  prosthesis.  Pre admission chest x-ray:  No change.  No active disease.  Pre  admission EKG:  Normal sinus rhythm with a rate of 68.   HOSPITAL COURSE:  The patient was admitted to [facility] and taken  to the operating room.  He underwent the above stated procedure without  complication.  The patient tolerated the procedure well  and was allowed to  return to the recovery room and then to the orthopedic floor to continue his  postoperative care.  A Hemovac drain was placed at the time of surgery.  It  was discontinued on postoperative day one.  The patient was placed on PC  analgesia for pain control following surgery.  PCA was kept until  postoperative day #3 when he was weaned over to oral analgesics.  The  patient's hemoglobin and hematocrit were followed throughout  hospitalization.  He had a slight decrease in his hemoglobin to 9.5, but  then stabilized prior to transfer.  The patient did not require blood  transfusions.  Physical therapy was consulted for gait training/ambulation  but patient was a little slow to progress but he has ambulated approximately  90 feet.  The patient is unable to go home due to the fact that his wife is  unable to offer any assistance due to health issues and he will be  transferred to a skilled nursing facility for approximately one week to gain  increased mobility and strength.   DISPOSITION:  The patient is to be transferred to skilled nursing facility   June 06, 2003.   DISCHARGE MEDICATIONS:  1. Colace 100 mg p.o. b.i.d.  2. Trinsicon one tablet t.i.d.  3. Coumadin 3 mg daily.  4. Coreg 12.5 mg b.i.d.  5. Lipitor 5 mg every other day.  6. Glucophage 500 mg daily.  7. Pepcid 20 mg daily.  8. Reglan 10 mg q.8h. p.r.n.  9. Percocet 5/325 one or two tablets q.4h. as needed for pain.  10.      Robaxin 500 mg one q.6h. as needed for muscle spasm.  11.      Tylenol 325 mg one to two q.4h. p.r.n.   DIET:  2000 calorie ADA diet.   ACTIVITY:  Total hip precautions.  Touchdown weightbearing with walker.   FOLLOWUP:  The patient is to follow up with Windy Fast A. Gioffre, M.D. in one  week for staple removal.   CONDITION ON DISCHARGE:  Improved.     Ebbie Ridge. Paitsel, P.A.                     Ronald A. Darrelyn Hillock, M.D.    Tilden Dome  D:  06/06/2003  T:  06/06/2003  Job:  [id number]

## 2011-02-27 NOTE — Op Note (Signed)
[place]  Patient:    [name], [name]                        MRN: [id number] Proc. Date: 11/12/00 Adm. Date:  [id number] Attending:  Cleatrice Burke CC:         Jesse Sans. Wall, M.D. [facility]  [facility]   Operative Report  PREOPERATIVE DIAGNOSIS:  Severe three-vessel coronary artery disease with unstable angina.  POSTOPERATIVE DIAGNOSIS:  Severe three-vessel coronary artery disease with unstable angina.  PROCEDURES:  Median sternotomy, extracorporeal circulation, coronary artery bypass graft surgery x 5 using a left internal mammary artery graft to the left anterior descending coronary artery, with a saphenous vein graft to the diagonal branch of the left anterior descending coronary artery, a sequential saphenous vein graft to the first and second obtuse marginal branches of the left circumflex coronary artery, and a saphenous vein graft to the posterior descending branch of the right coronary artery.  SURGEON:  Alleen Borne, M.D.  ASSISTANT:  Areta Haber, P.A.  ANESTHESIA:  General endotracheal.  CLINICAL HISTORY:  This patient is a 77 year old gentleman with a history of coronary artery disease, status post angioplasty of the right coronary artery in 1989.  Apparently he had restenosis six months later with a 90-95% stenosis.  He was treated medically.  He now presents with unstable angina at rest.  He underwent cardiac catheterization, which showed normal left main coronary artery, though it had some calcium in it.  The LAD had 40-50% midvessel disease and a 70% distal stenosis.  There was a 90% second diagonal stenosis.  The left circumflex had 90% proximal stenosis and 90% first marginal stenosis.  There was about 70% distal stenosis before a small third marginal branch.  The right coronary artery had complete occlusion proximally with faint left to right collaterals.  Left ventricular ejection  fraction was 45-50%.  After review of the angiograms and examination of the patient, it was felt that coronary artery bypass graft surgery was the best treatment.  The patient was anemic and had a history of anemia, but it was felt that this workup could be done postoperatively since the patient had no evidence of acute bleeding.  I discussed the operative procedure of coronary bypass surgery with him and his wife, including alternatives, methods, and risks, including bleeding, possible blood transfusion, infection, stroke, myocardial infarction, and death.  They understood and agreed to proceed.  DESCRIPTION OF PROCEDURE:  The patient was taken to the operating room and placed on the table in the supine position.  After induction of general endotracheal anesthesia, a Foley catheter was placed in the bladder using sterile technique.  Then the chest, abdomen, and both lower extremities were prepped and draped in the usual sterile manner.  The chest was entered through a median sternotomy incision and the pericardium opened in the midline. Examination of the heart showed good ventricular contractility.  The ascending aorta had no palpable plaques in it.  Then the left internal mammary artery was harvested from the chest wall as a pedicle graft.  This was a medium-caliber vessel with excellent blood flow through it.  At the same time, a segment of greater saphenous vein was harvested from the right lower leg, and this vein was of medium size and good quality.  Then the patient was heparinized and when an adequate activated clotting time was achieved, the distal ascending aorta was cannulated  using a 20 French aortic cannula for arterial inflow.  Venous outflow was achieved using a two-stage venous cannula for the right atrial appendage.  An antegrade cardioplegia and vent cannula was inserted in the aortic root.  The patient was placed on cardiopulmonary bypass and distal coronary  arteries identified.  The LAD was diffusely diseased in its proximal and midportions. The very distal portion of the vessel appeared free of disease and was large enough for grafting.  The second diagonal branch was a medium-size graftable vessel.  The first marginal was small, and the second marginal was much larger.  The third marginal was too small to graft.  The right coronary artery gave off a single posterior descending branch and a few small posterolateral branches that were tiny.  The posterior descending was a large graftable vessel with no significant disease in it.  There was evidence of previous inferior myocardial infarction with some scar present.  Then the aorta was crossclamped and 500 cc of cold blood antegrade cardioplegia was administered in the aortic root with quick arrest of the heart.  Systemic hypothermia at 20 degrees Centigrade and topical hypothermia with iced saline was used.  A temperature probe was placed in the septum and an insulating pad in the pericardium.  The first distal anastomosis was performed to the posterior descending branch of the right coronary artery.  The internal diameter was 1.75 mm.  The conduit used was a segment of greater saphenous vein and the anastomosis performed in an end-to-side manner using continuous 7-0 Prolene suture.  Flow was measured through the graft and was excellent.  Another dose of cardioplegia was given down this vein graft  The second distal anastomosis was performed to the first marginal branch.  The internal diameter was about 1.6 mm.  The conduit used was a second segment of greater saphenous vein and the anastomosis performed in an end-to-side manner using continuous 7-0 Prolene suture.  Flow was measured through the graft and was excellent.  The third distal anastomosis was performed to the second marginal branch.  The internal diameter was 2 mm.  The conduit used was the same segment of the greater saphenous  vein and the anastomosis performed in a sequential  end-to-side manner using continuous 7-0 Prolene suture.  Flow was measured through the graft and was excellent.  The fourth distal anastomosis was performed to the diagonal branch.  The internal diameter was 1.6 mm.  The conduit used was a third segment of greater saphenous vein and the anastomosis performed in an end-to-side manner using continuous 7-0 Prolene suture.  Flow was measured through the graft and was excellent.  Then another dose of cardioplegia was given down the vein grafts and in the aortic root.  The fifth distal anastomosis was performed to the distal portion of the left anterior descending coronary artery.  The internal diameter here was about 1.75 mm.  The conduit used was the left internal mammary artery graft, and this was brought through an opening in the left pericardium anterior to the phrenic nerve.  It was anastomosed to the LAD in an end-to-side manner using continuous 8-0 Prolene suture.  The pedicle was tacked to the epicardium with 6-0 nylon sutures.  The patient was rewarmed to 37 degrees Centigrade, and the clamp was removed from the mammary pedicle.  There was rapid warming of the ventricular septum and return of spontaneous ventricular fibrillation.  The crossclamp was removed with a time of 59 minutes, and the patient spontaneously converted  to sinus rhythm.  A partial occlusion clamp was placed on the aortic root, and the three proximal vein graft anastomoses were performed in end-to-side manner using continuous 6-0 Prolene suture.  The clamp was removed, the vein grafts de-aired, and the clamps removed from them.  The proximal and distal anastomoses appeared hemostatic and the alignment of the grafts satisfactory. Graft markers were placed on the proximal anastomoses.  Two temporary right ventricular and right atrial pacing wires were placed and brought out through the skin.  When the patient  had rewarmed to 37 degrees Centigrade, he was weaned from cardiopulmonary bypass on no inotropic agents.  Total bypass time was 101 minutes.  Cardiac function appeared excellent with cardiac output of 5 L/min. Protamine was given, and the venous and aortic cannulae were removed without difficulty.  Hemostasis was achieved.  Three chest tubes were placed, with a tube in the posterior pericardium, one in the left pleural space, and one in the anterior mediastinum.  The pericardium was reapproximated over the heart. The sternum was closed with #6 stainless steel wires.  The fascia was closed with continuous #1 Vicryl suture.  The subcutaneous tissue was closed using 2v and the skin with 3-0 Vicryl subcuticular closure.  The lower extremity vein harvest site was closed in layers in a similar manner.  The sponge, needle, and instrument counts were correct according to the scrub nurse.  Dry sterile dressings were applied over the incisions, around the chest tubes, which were hooked to Pleuravac suction.  The patient remained hemodynamically stable and was transported to the SICU in guarded but stable condition. DD:  11/12/00 TD:  11/13/00 Job: [id number]

## 2011-02-27 NOTE — H&P (Signed)
NAME:  [name], [name]                           ACCOUNT NO.:  [id number]   MEDICAL RECORD NO.:  [id number]                   PATIENT TYPE:  INP   LOCATION:  NA                                   [facility]  [facility]   PHYSICIAN:  Georges Lynch. Gioffre, M.D.             DATE OF BIRTH:  January 28, 1926   DATE OF ADMISSION:  05/30/2003  DATE OF DISCHARGE:                                HISTORY & PHYSICAL   HISTORY:  The patient has had left hip pain for approximately one year.  He  walks with a limp.  He has required a cane to ambulate due to the increasing  pain.  His x-ray revealed degenerative arthritis.  MRI of his hip in June  revealed severe degenerative changes.  Due to the increasing pain and  difficulty ambulating, the patient has elected to proceed with a left total  hip arthroplasty.   PRIMARY CARE Beatrice Sehgal:  Dr. Judithann Sheen in [place].   CARDIOLOGIST:  Dr. Daleen Squibb.   ALLERGIES:  1. PENICILLIN.  2. X-RAY DYE.   PAST MEDICAL HISTORY:  1. Hypertension.  2. Gastroesophageal reflux disease.  3. Kidney stones.  4. History of an MI in 1989.  5. Prostate cancer.   CURRENT MEDICATIONS:  1. Coreg 12.5 mg twice daily.  2. Metformin 500 mg daily.  3. Lipitor 10 mg one-half tablet every other day.   SURGERIES:  1. Angioplasty in 1989.  2. Hernia repair in 1994.  3. Cardiac bypass surgery in 2002, five vessel.  4. Radiation seed implants for prostate cancer.   FAMILY HISTORY:  Father and mother had hypertension.  Father had diabetes.   REVIEW OF SYSTEMS:  GENERAL:  Denies weight change, fever, chills, fatigue.  HEENT:  Denies headache, visual changes, tinnitus, hearing loss, sore  throat.  CARDIOVASCULAR:  Denies chest pain, palpitations, shortness of  breath, orthopnea.  PULMONARY:  Denies dyspnea, wheezing, cough, sputum  production, hemoptysis.  GU:  Denies dysuria, frequency, urgency, hematuria.  MUSCULOSKELETAL:  The patient does have severe left hip pain, worse with  ambulation.   NEUROLOGIC:  Denies dizziness, vertigo, syncope, seizures.  SKIN:  Denies itching rashes, masses, or moles.   PHYSICAL EXAMINATION:  VITAL SIGNS:  Temperature 97.5, pulse 64,  respirations 18, blood pressure 120/70 in right arm sitting.  GENERAL:  A 77 year old white male in no acute distress.  HEENT:  Pupils equal, round and reactive to light.  Extraocular movements  intact.  Pharynx clear.  TM's intact.  NECK:  Supple without masses.  CHEST:  Clear to auscultation bilaterally.  No wheezes, rales, or rhonchi  noted.  HEART:  Regular rate and rhythm without murmurs, gallops or rubs.  ABDOMEN:  Positive bowel sounds, soft, nontender.  EXTREMITIES:  Left hip with decreased range of motion, very painful.  SKIN:  Warm and dry.   X-ray of his left hip reveals severe degenerative arthritis.   IMPRESSION:  Degenerative arthritis, left hip.   PLAN:  The patient is to be admitted to [facility] on May 30, 2003 to undergo a left total hip arthroplasty.     Ebbie Ridge. Paitsel, P.A.                     Ronald A. Darrelyn Hillock, M.D.    Tilden Dome  D:  05/29/2003  T:  05/29/2003  Job:  [id number]   cc:   Dr. Sindy Guadeloupe C. Wall, M.D.

## 2011-03-05 ENCOUNTER — Ambulatory Visit: Payer: Self-pay | Admitting: Internal Medicine

## 2011-04-02 ENCOUNTER — Encounter: Payer: Self-pay | Admitting: Cardiovascular Disease

## 2011-11-17 ENCOUNTER — Encounter: Payer: Self-pay | Admitting: Cardiovascular Disease

## 2011-11-17 LAB — CBC
HCT: 36.2 % — ABNORMAL LOW (ref 40.0–52.0)
MCHC: 32.8 g/dL (ref 32.0–36.0)
MCV: 86 fL (ref 80–100)
Platelet: 311 10*3/uL (ref 150–440)
RBC: 4.21 10*6/uL — ABNORMAL LOW (ref 4.40–5.90)
RDW: 15.8 % — ABNORMAL HIGH (ref 11.5–14.5)
WBC: 8.1 10*3/uL (ref 3.8–10.6)

## 2011-11-17 LAB — CK TOTAL AND CKMB (NOT AT ARMC)
CK, Total: 38 U/L (ref 35–232)
CK-MB: 1.2 ng/mL (ref 0.5–3.6)

## 2011-11-17 LAB — COMPREHENSIVE METABOLIC PANEL
Albumin: 2.7 g/dL — ABNORMAL LOW (ref 3.4–5.0)
Anion Gap: 11 (ref 7–16)
Calcium, Total: 8.4 mg/dL — ABNORMAL LOW (ref 8.5–10.1)
Chloride: 107 mmol/L (ref 98–107)
Co2: 24 mmol/L (ref 21–32)
EGFR (African American): >60
EGFR (Non-African Amer.): >60
Osmolality: 285 (ref 275–301)
Potassium: 4.2 mmol/L (ref 3.5–5.1)
SGOT(AST): 23 U/L (ref 15–37)
Sodium: 142 mmol/L (ref 136–145)

## 2011-11-17 LAB — TROPONIN I: Troponin-I: 0.03 ng/mL

## 2011-11-18 ENCOUNTER — Inpatient Hospital Stay: Payer: Self-pay | Admitting: Internal Medicine

## 2011-11-18 ENCOUNTER — Encounter: Payer: Self-pay | Admitting: Cardiovascular Disease

## 2011-11-18 DIAGNOSIS — R0602 Shortness of breath: Secondary | ICD-10-CM

## 2011-11-18 DIAGNOSIS — I359 Nonrheumatic aortic valve disorder, unspecified: Secondary | ICD-10-CM

## 2011-11-18 LAB — TROPONIN I
Troponin-I: 0.05 ng/mL
Troponin-I: 0.06 ng/mL — ABNORMAL HIGH

## 2011-11-18 LAB — CK TOTAL AND CKMB (NOT AT ARMC): CK, Total: 38 U/L (ref 35–232)

## 2011-11-19 DIAGNOSIS — I509 Heart failure, unspecified: Secondary | ICD-10-CM

## 2011-11-19 LAB — BASIC METABOLIC PANEL
Anion Gap: 10 (ref 7–16)
BUN: 22 mg/dL — ABNORMAL HIGH (ref 7–18)
Calcium, Total: 8.8 mg/dL (ref 8.5–10.1)
Chloride: 103 mmol/L (ref 98–107)
Co2: 28 mmol/L (ref 21–32)
EGFR (African American): >60
EGFR (Non-African Amer.): >60
Osmolality: 286 (ref 275–301)
Potassium: 4 mmol/L (ref 3.5–5.1)
Sodium: 141 mmol/L (ref 136–145)

## 2011-11-19 LAB — MAGNESIUM: Magnesium: 1.5 mg/dL — ABNORMAL LOW

## 2011-11-19 LAB — CBC WITH DIFFERENTIAL/PLATELET
Basophil %: 0.4 %
Eosinophil #: 0.2 10*3/uL (ref 0.0–0.7)
Eosinophil %: 2.8 %
HCT: 35.1 % — ABNORMAL LOW (ref 40.0–52.0)
HGB: 11.5 g/dL — ABNORMAL LOW (ref 13.0–18.0)
Lymphocyte %: 25.3 %
MCH: 28.2 pg (ref 26.0–34.0)
MCHC: 32.9 g/dL (ref 32.0–36.0)
MCV: 86 fL (ref 80–100)
Neutrophil #: 4.8 10*3/uL (ref 1.4–6.5)
Neutrophil %: 61.5 %
Platelet: 312 10*3/uL (ref 150–440)
RBC: 4.09 10*6/uL — ABNORMAL LOW (ref 4.40–5.90)
RDW: 16.3 % — ABNORMAL HIGH (ref 11.5–14.5)

## 2011-12-09 ENCOUNTER — Encounter: Payer: Medicare Other | Admitting: Cardiology

## 2011-12-10 ENCOUNTER — Encounter: Payer: Self-pay | Admitting: Cardiovascular Disease

## 2011-12-10 ENCOUNTER — Ambulatory Visit (INDEPENDENT_AMBULATORY_CARE_PROVIDER_SITE_OTHER): Payer: Medicare Other | Admitting: Cardiovascular Disease

## 2011-12-10 VITALS — BP 110/60 | HR 66 | Ht 68.0 in | Wt 139.5 lb

## 2011-12-10 DIAGNOSIS — I10 Essential (primary) hypertension: Secondary | ICD-10-CM

## 2011-12-10 DIAGNOSIS — E785 Hyperlipidemia, unspecified: Secondary | ICD-10-CM

## 2011-12-10 DIAGNOSIS — I2510 Atherosclerotic heart disease of native coronary artery without angina pectoris: Secondary | ICD-10-CM

## 2011-12-10 DIAGNOSIS — I350 Nonrheumatic aortic (valve) stenosis: Secondary | ICD-10-CM

## 2011-12-10 DIAGNOSIS — I359 Nonrheumatic aortic valve disorder, unspecified: Secondary | ICD-10-CM

## 2011-12-10 NOTE — Assessment & Plan Note (Signed)
Currently with no symptoms of angina. No further workup at this time. Continue current medication regimen. 

## 2011-12-10 NOTE — Patient Instructions (Addendum)
You are doing well. No medication changes were made. If your blood pressure drops too low (top number less than 100), then cut the losartan in 1/2  Please call us if you have new issues that need to be addressed before your next appt.  Your physician wants you to follow-up in: 6 months.  You will receive a reminder letter in the mail two months in advance. If you don't receive a letter, please call our office to schedule the follow-up appointment.

## 2011-12-10 NOTE — Assessment & Plan Note (Signed)
Goal LDL less than 70. Continue statin 

## 2011-12-10 NOTE — Assessment & Plan Note (Addendum)
He is taking carvedilol and losartan 100 mg daily. He is taking Norvasc only as needed. He is not taking isosorbide as his blood pressure has been low

## 2011-12-10 NOTE — Assessment & Plan Note (Signed)
Mild aortic valve stenosis by echocardiogram 11/2011

## 2011-12-10 NOTE — Progress Notes (Signed)
Patient ID: [name], male    DOB: November 01, 1925, 78 y.o.   MRN: [id number]  HPI Comments: Mr. Brueckner is a pleasant 78 year old gentleman with coronary artery disease, history of MI, bypass x5, hypertension, hyperlipidemia, diabetes, prostate cancer, peptic ulcer disease who presented to [facility] on 11/18/2011 with shortness of breath for one month, orthopnea, PND.  He has mild aortic valve stenosis Previous stress test January 2012 showed no ischemia Echocardiogram done in the hospital showed at least mild aortic valve stenosis, moderate LVH, ejection fraction 45% Chest x-ray suggested COPD with CHF  He had gentle diuresis through his hospital course with improvement of his symptoms. EKG in the hospital showed frequent PVCs Lab work from the hospital showed BNP 6375 He reports having chronic diarrhea He has not been taking his Norvasc on a regular basis as his blood pressure has been running low. He takes this as needed for hypertension, last dose was yesterday.  EKG today shows normal sinus rhythm with rate 66 beats per minute with frequent PVCs, old anterior MI, left axis deviation/left anterior fascicular block  Outpatient Encounter Prescriptions as of 12/10/2011  Medication Sig Dispense Refill  . ALPRAZolam (XANAX) 0.25 MG tablet Take 0.25 mg by mouth every 8 (eight) hours as needed.        Marland Kitchen amLODipine (NORVASC) 5 MG tablet Take 5 mg by mouth daily as needed.       Marland Kitchen aspirin 325 MG tablet Take 325 mg by mouth daily.        . carvedilol (COREG) 25 MG tablet Take 25 mg by mouth 2 (two) times daily with a meal.      . Cinnamon 500 MG capsule Take 500 mg by mouth daily.      . Ferrous Gluconate 324 (37.5 FE) MG TABS Take by mouth. Every other day        . furosemide (LASIX) 40 MG tablet Take 40 mg by mouth daily as needed.       . Loperamide HCl (IMODIUM A-D) 2 MG CHEW Chew by mouth as needed.        Marland Kitchen losartan (COZAAR) 100 MG tablet Take 100 mg by mouth daily.      . nitroGLYCERIN  (NITROSTAT) 0.4 MG SL tablet Place 0.4 mg under the tongue every 5 (five) minutes as needed.        Marland Kitchen omeprazole (PRILOSEC) 40 MG capsule Take 40 mg by mouth daily.        . ondansetron (ZOFRAN) 4 MG tablet Take 4 mg by mouth every 8 (eight) hours as needed.      . saccharomyces boulardii (FLORASTOR) 250 MG capsule Take 250 mg by mouth daily.        . simvastatin (ZOCOR) 40 MG tablet Take 40 mg by mouth. 2 a week at bedtime       . zolpidem (AMBIEN) 10 MG tablet Take 10 mg by mouth at bedtime as needed.        Review of Systems  Constitutional: Negative.   HENT: Negative.   Eyes: Negative.   Respiratory: Negative.   Cardiovascular: Negative.   Gastrointestinal: Negative.   Musculoskeletal: Negative.   Skin: Negative.   Neurological: Negative.   Hematological: Negative.   Psychiatric/Behavioral: Negative.   All other systems reviewed and are negative.    BP 110/60  Pulse 66  Ht 5\' 8"  (1.727 m)  Wt 139 lb 8 oz (63.277 kg)  BMI 21.21 kg/m2  Physical Exam  Nursing note and vitals  reviewed. Constitutional: He is oriented to person, place, and time. He appears well-developed and well-nourished.  HENT:  Head: Normocephalic.  Nose: Nose normal.  Mouth/Throat: Oropharynx is clear and moist.  Eyes: Conjunctivae are normal. Pupils are equal, round, and reactive to light.  Neck: Normal range of motion. Neck supple. No JVD present.  Cardiovascular: Normal rate, regular rhythm, S1 normal, S2 normal and intact distal pulses.  Exam reveals no gallop and no friction rub.   Murmur heard.  Crescendo systolic murmur is present with a grade of 2/6  Pulmonary/Chest: Effort normal and breath sounds normal. No respiratory distress. He has no wheezes. He has no rales. He exhibits no tenderness.  Abdominal: Soft. Bowel sounds are normal. He exhibits no distension. There is no tenderness.  Musculoskeletal: Normal range of motion. He exhibits no edema and no tenderness.  Lymphadenopathy:    He has  no cervical adenopathy.  Neurological: He is alert and oriented to person, place, and time. Coordination normal.  Skin: Skin is warm and dry. No rash noted. No erythema.  Psychiatric: He has a normal mood and affect. His behavior is normal. Judgment and thought content normal.           Assessment and Plan

## 2012-02-12 ENCOUNTER — Encounter: Payer: Self-pay | Admitting: Cardiovascular Disease

## 2012-03-15 ENCOUNTER — Ambulatory Visit: Payer: Self-pay

## 2012-04-15 ENCOUNTER — Encounter: Payer: Self-pay | Admitting: Cardiovascular Disease

## 2012-04-15 ENCOUNTER — Ambulatory Visit (INDEPENDENT_AMBULATORY_CARE_PROVIDER_SITE_OTHER): Payer: Medicare Other | Admitting: Cardiovascular Disease

## 2012-04-15 VITALS — BP 145/72 | HR 60 | Ht 68.0 in | Wt 138.0 lb

## 2012-04-15 DIAGNOSIS — I350 Nonrheumatic aortic (valve) stenosis: Secondary | ICD-10-CM

## 2012-04-15 DIAGNOSIS — I2510 Atherosclerotic heart disease of native coronary artery without angina pectoris: Secondary | ICD-10-CM

## 2012-04-15 DIAGNOSIS — I10 Essential (primary) hypertension: Secondary | ICD-10-CM

## 2012-04-15 DIAGNOSIS — E785 Hyperlipidemia, unspecified: Secondary | ICD-10-CM

## 2012-04-15 DIAGNOSIS — I359 Nonrheumatic aortic valve disorder, unspecified: Secondary | ICD-10-CM

## 2012-04-15 DIAGNOSIS — R079 Chest pain, unspecified: Secondary | ICD-10-CM

## 2012-04-15 NOTE — Assessment & Plan Note (Signed)
Mild aortic valve stenosis on echocardiogram earlier this year. No need for echocardiogram this year.

## 2012-04-15 NOTE — Assessment & Plan Note (Signed)
Blood pressure is well controlled on today's visit. No changes made to the medications. 

## 2012-04-15 NOTE — Patient Instructions (Addendum)
You are doing well. No medication changes were made. Please restart simvastatin  Please call us if you have new issues that need to be addressed before your next appt.  Your physician wants you to follow-up in: 6 months.  You will receive a reminder letter in the mail two months in advance. If you don't receive a letter, please call our office to schedule the follow-up appointment.

## 2012-04-15 NOTE — Assessment & Plan Note (Signed)
We have suggested he take his simvastatin more consistently given his underlying coronary artery disease, diabetes. Goal LDL less than 70.

## 2012-04-15 NOTE — Assessment & Plan Note (Signed)
Chest pain as mentioned. We will closely watch him and he will call the office for any additional episodes.

## 2012-04-15 NOTE — Assessment & Plan Note (Signed)
2 episodes of chest pain this past week. He does not feel it is cardiac. He wonders if it could be GI given his recent diarrhea. I have suggested he call us immediately if he has additional episodes of discomfort. Is not interested in stress testing at this time.

## 2012-04-15 NOTE — Progress Notes (Signed)
Patient ID: [name], male    DOB: Feb 24, 1926, 78 y.o.   MRN: [id number]  HPI Comments: [name] is a pleasant 78 year old gentleman with coronary artery disease, history of MI, bypass x5, hypertension, hyperlipidemia, diabetes, prostate cancer, peptic ulcer disease who presented to [facility] on 11/18/2011 with shortness of breath for one month, orthopnea, PND. He presents for routine followup.   He has mild aortic valve stenosis. Previous stress test January 2012 showed no ischemia Echocardiogram done in the hospital showed at least mild aortic valve stenosis, moderate LVH, ejection fraction 45% Chest x-ray suggested COPD with CHF He had gentle diuresis through his hospital course with improvement of his symptoms.  He reports that over the past year, he has been doing well. He did have 2 episodes of chest pain earlier this week at rest. He did not feel that it was his heart. He did have a fall last weekend with some rib trauma. He has had diarrhea this past week, improved with Lomotil. No significant edema, no lightheadedness or dizziness. He reports at least 5 episodes of diarrhea  EKG today shows normal sinus rhythm with rate 66 beats per minute, old anterior MI, left axis deviation/left anterior fascicular block  Blood work June 2013 shows normal creatinine 0.9, total cholesterol 139, LDL 83, HDL 33 He reports that he is not taking his simvastatin on a consistent basis  Outpatient Encounter Prescriptions as of 04/15/2012  Medication Sig Dispense Refill  . ALPRAZolam (XANAX) 0.25 MG tablet Take 0.25 mg by mouth every 8 (eight) hours as needed.        Marland Kitchen amLODipine (NORVASC) 5 MG tablet Take 5 mg by mouth daily.       Marland Kitchen aspirin 81 MG tablet Take 81 mg by mouth daily.      . carvedilol (COREG) 25 MG tablet Take 25 mg by mouth 2 (two) times daily with a meal.      . Cinnamon 500 MG capsule Take 500 mg by mouth daily.      . Ferrous Gluconate 324 (37.5 FE) MG TABS Take by mouth. Every other  day        . furosemide (LASIX) 40 MG tablet Take 40 mg by mouth daily as needed.       . Loperamide HCl (IMODIUM A-D) 2 MG CHEW Chew by mouth as needed.        Marland Kitchen losartan (COZAAR) 100 MG tablet Take 100 mg by mouth daily.      . nitroGLYCERIN (NITROSTAT) 0.4 MG SL tablet Place 0.4 mg under the tongue every 5 (five) minutes as needed.        Marland Kitchen omeprazole (PRILOSEC) 40 MG capsule Take 40 mg by mouth daily.        . ondansetron (ZOFRAN) 4 MG tablet Take 4 mg by mouth every 8 (eight) hours as needed.      . saccharomyces boulardii (FLORASTOR) 250 MG capsule Take 250 mg by mouth daily.        . simvastatin (ZOCOR) 40 MG tablet Take 40 mg by mouth. 2 a week at bedtime         Review of Systems  Constitutional: Positive for unexpected weight change.  HENT: Negative.   Eyes: Negative.   Respiratory: Negative.   Cardiovascular: Positive for chest pain.  Gastrointestinal: Positive for diarrhea.  Musculoskeletal: Negative.   Skin: Negative.   Neurological: Negative.   Hematological: Negative.   Psychiatric/Behavioral: Negative.   All other systems reviewed and are negative.  BP 145/72  Pulse 60  Ht 5\' 8"  (1.727 m)  Wt 138 lb (62.596 kg)  BMI 20.98 kg/m2  Physical Exam  Nursing note and vitals reviewed. Constitutional: He is oriented to person, place, and time. He appears well-developed and well-nourished.  HENT:  Head: Normocephalic.  Nose: Nose normal.  Mouth/Throat: Oropharynx is clear and moist.  Eyes: Conjunctivae are normal. Pupils are equal, round, and reactive to light.  Neck: Normal range of motion. Neck supple. No JVD present.  Cardiovascular: Normal rate, regular rhythm, S1 normal, S2 normal and intact distal pulses.  Exam reveals no gallop and no friction rub.   Murmur heard.  Crescendo systolic murmur is present with a grade of 2/6  Pulmonary/Chest: Effort normal and breath sounds normal. No respiratory distress. He has no wheezes. He has no rales. He exhibits no  tenderness.  Abdominal: Soft. Bowel sounds are normal. He exhibits no distension. There is no tenderness.  Musculoskeletal: Normal range of motion. He exhibits no edema and no tenderness.  Lymphadenopathy:    He has no cervical adenopathy.  Neurological: He is alert and oriented to person, place, and time. Coordination normal.  Skin: Skin is warm and dry. No rash noted. No erythema.  Psychiatric: He has a normal mood and affect. His behavior is normal. Judgment and thought content normal.           Assessment and Plan

## 2012-04-18 ENCOUNTER — Telehealth: Payer: Self-pay

## 2012-04-18 NOTE — Telephone Encounter (Signed)
Error

## 2012-07-15 ENCOUNTER — Ambulatory Visit: Payer: Self-pay | Admitting: Internal Medicine

## 2012-07-25 ENCOUNTER — Other Ambulatory Visit: Payer: Self-pay | Admitting: Urology

## 2012-07-25 ENCOUNTER — Ambulatory Visit
Admission: RE | Admit: 2012-07-25 | Discharge: 2012-07-25 | Disposition: A | Discharge status: Home / Self Care | Payer: Medicare Other | Source: Ambulatory Visit | Attending: Urology | Admitting: Urology

## 2012-07-25 DIAGNOSIS — IMO0001 Reserved for inherently not codable concepts without codable children: Secondary | ICD-10-CM

## 2012-10-17 ENCOUNTER — Ambulatory Visit (INDEPENDENT_AMBULATORY_CARE_PROVIDER_SITE_OTHER): Payer: Medicare Other | Admitting: Cardiovascular Disease

## 2012-10-17 ENCOUNTER — Encounter: Payer: Self-pay | Admitting: Cardiovascular Disease

## 2012-10-17 VITALS — BP 120/72 | HR 69 | Ht 67.0 in | Wt 134.5 lb

## 2012-10-17 DIAGNOSIS — E785 Hyperlipidemia, unspecified: Secondary | ICD-10-CM

## 2012-10-17 DIAGNOSIS — I2510 Atherosclerotic heart disease of native coronary artery without angina pectoris: Secondary | ICD-10-CM

## 2012-10-17 DIAGNOSIS — I359 Nonrheumatic aortic valve disorder, unspecified: Secondary | ICD-10-CM

## 2012-10-17 DIAGNOSIS — I10 Essential (primary) hypertension: Secondary | ICD-10-CM

## 2012-10-17 DIAGNOSIS — I350 Nonrheumatic aortic (valve) stenosis: Secondary | ICD-10-CM

## 2012-10-17 NOTE — Assessment & Plan Note (Signed)
We have suggested that he restart simvastatin 20 mg 3 times per week. He was previously taking simvastatin 40 mg 2 times per week. He has followup with Dr. Judithann Sheen in several months time with lab work.

## 2012-10-17 NOTE — Assessment & Plan Note (Signed)
Blood pressure is well controlled on today's visit. No changes made to the medications. We have suggested he continues to lose weight, his blood pressure may drop and we might need to reduce his doses.

## 2012-10-17 NOTE — Assessment & Plan Note (Signed)
Currently with no symptoms of angina. No further workup at this time. Continue current medication regimen. 

## 2012-10-17 NOTE — Patient Instructions (Addendum)
You are doing well. Please restart simvastatin/zocor 1/2 pill three times a week  Please call us if you have new issues that need to be addressed before your next appt.  Your physician wants you to follow-up in: 6 months.  You will receive a reminder letter in the mail two months in advance. If you don't receive a letter, please call our office to schedule the follow-up appointment.

## 2012-10-17 NOTE — Progress Notes (Signed)
Patient ID: [name], male    DOB: [birth year], 79 y.o.   MRN: [id number]  HPI Comments: [name] is a pleasant 79 year old gentleman with coronary artery disease, history of MI, bypass x5, hypertension, hyperlipidemia, diabetes, prostate cancer, peptic ulcer disease who presented to [facility] on 11/18/2011 with shortness of breath for one month, orthopnea, PND. He presents for routine followup.   He has mild aortic valve stenosis. Previous stress test January 2012 showed no ischemia Echocardiogram done in the hospital February 2013 showed at least mild aortic valve stenosis, moderate LVH, ejection fraction 45% Chest x-ray suggested COPD with CHF He had gentle diuresis through his hospital course with improvement of his symptoms.  He continues to have chronic diarrhea, takes Imodium twice a day with mild improvement of his symptoms. No recent chest pain. He is less active, legs are weaker. He continues to lose weight, down 3-4 pounds since his last visit. He denies any significant lower external edema, cough, PND or orthopnea.  EKG today shows normal sinus rhythm with rate 69 beats per minute, old anterior MI, left axis deviation/left anterior fascicular block  Blood work June 2013 shows normal creatinine 0.9, total cholesterol 139, LDL 83, HDL 33 He reports that he is not taking his simvastatin on a consistent basis  Outpatient Encounter Prescriptions as of 10/17/2012  Medication Sig Dispense Refill  . ALPRAZolam (XANAX) 0.25 MG tablet Take 0.25 mg by mouth every 8 (eight) hours as needed.        Marland Kitchen amLODipine (NORVASC) 5 MG tablet Take 5 mg by mouth daily.       Marland Kitchen aspirin 81 MG tablet Take 81 mg by mouth daily.      . carvedilol (COREG) 25 MG tablet Take 25 mg by mouth 2 (two) times daily with a meal.      . Cinnamon 500 MG capsule Take 500 mg by mouth daily.      . Ferrous Gluconate 324 (37.5 FE) MG TABS Take by mouth.       . furosemide (LASIX) 40 MG tablet Take 40 mg by mouth daily as  needed.       . Loperamide HCl (IMODIUM A-D) 2 MG CHEW Chew by mouth as needed.       Marland Kitchen losartan (COZAAR) 100 MG tablet Take 100 mg by mouth daily.      . nitroGLYCERIN (NITROSTAT) 0.4 MG SL tablet Place 0.4 mg under the tongue every 5 (five) minutes as needed.        Marland Kitchen omeprazole (PRILOSEC) 40 MG capsule Take 40 mg by mouth daily.        . ondansetron (ZOFRAN) 4 MG tablet Take 4 mg by mouth every 8 (eight) hours as needed.      . saccharomyces boulardii (FLORASTOR) 250 MG capsule Take 250 mg by mouth daily.        . simvastatin (ZOCOR) 40 MG tablet Take two tablets weekly. Currently not taking          Review of Systems  Constitutional: Positive for unexpected weight change.  HENT: Negative.   Eyes: Negative.   Respiratory: Negative.   Gastrointestinal: Positive for diarrhea.  Musculoskeletal: Negative.   Skin: Negative.   Neurological: Negative.   Hematological: Negative.   Psychiatric/Behavioral: Negative.   All other systems reviewed and are negative.    BP 120/72  Pulse 69  Ht 5\' 7"  (1.702 m)  Wt 134 lb 8 oz (61.009 kg)  BMI 21.07 kg/m2  Physical Exam  Nursing note and vitals reviewed. Constitutional: He is oriented to person, place, and time. He appears well-developed and well-nourished.  HENT:  Head: Normocephalic.  Nose: Nose normal.  Mouth/Throat: Oropharynx is clear and moist.  Eyes: Conjunctivae normal are normal. Pupils are equal, round, and reactive to light.  Neck: Normal range of motion. Neck supple. No JVD present.  Cardiovascular: Normal rate, regular rhythm, S1 normal, S2 normal and intact distal pulses.  Exam reveals no gallop and no friction rub.   Murmur heard.  Crescendo systolic murmur is present with a grade of 2/6  Pulmonary/Chest: Effort normal and breath sounds normal. No respiratory distress. He has no wheezes. He has no rales. He exhibits no tenderness.  Abdominal: Soft. Bowel sounds are normal. He exhibits no distension. There is no  tenderness.  Musculoskeletal: Normal range of motion. He exhibits no edema and no tenderness.  Lymphadenopathy:    He has no cervical adenopathy.  Neurological: He is alert and oriented to person, place, and time. Coordination normal.  Skin: Skin is warm and dry. No rash noted. No erythema.  Psychiatric: He has a normal mood and affect. His behavior is normal. Judgment and thought content normal.           Assessment and Plan

## 2012-10-17 NOTE — Assessment & Plan Note (Signed)
mild aortic valve stenosis. No symptoms at this time. He is relatively sedentary.

## 2012-11-29 ENCOUNTER — Emergency Department: Payer: Self-pay | Admitting: Emergency Medicine

## 2012-11-29 LAB — CBC
HCT: 36.2 % — ABNORMAL LOW (ref 40.0–52.0)
MCHC: 33.3 g/dL (ref 32.0–36.0)
RBC: 4.33 10*6/uL — ABNORMAL LOW (ref 4.40–5.90)
RDW: 16.4 % — ABNORMAL HIGH (ref 11.5–14.5)
WBC: 6.8 10*3/uL (ref 3.8–10.6)

## 2012-11-29 LAB — URINALYSIS, COMPLETE
Bilirubin,UR: NEGATIVE
Blood: NEGATIVE
Glucose,UR: NEGATIVE mg/dL (ref 0–75)
Ketone: NEGATIVE
Leukocyte Esterase: NEGATIVE
Nitrite: NEGATIVE
Protein: NEGATIVE
RBC,UR: 1 /HPF (ref 0–5)
Specific Gravity: 1.017 (ref 1.003–1.030)
Squamous Epithelial: NONE SEEN

## 2012-11-29 LAB — COMPREHENSIVE METABOLIC PANEL
Albumin: 2.4 g/dL — ABNORMAL LOW (ref 3.4–5.0)
Alkaline Phosphatase: 166 U/L — ABNORMAL HIGH (ref 50–136)
Anion Gap: 5 — ABNORMAL LOW (ref 7–16)
Bilirubin,Total: 0.4 mg/dL (ref 0.2–1.0)
Calcium, Total: 8.5 mg/dL (ref 8.5–10.1)
Chloride: 108 mmol/L — ABNORMAL HIGH (ref 98–107)
Creatinine: 1.14 mg/dL (ref 0.60–1.30)
EGFR (African American): >60
Potassium: 4.2 mmol/L (ref 3.5–5.1)
SGOT(AST): 32 U/L (ref 15–37)
SGPT (ALT): 17 U/L (ref 12–78)
Sodium: 139 mmol/L (ref 136–145)
Total Protein: 7 g/dL (ref 6.4–8.2)

## 2012-11-29 LAB — TROPONIN I
Troponin-I: 0.04 ng/mL
Troponin-I: 0.04 ng/mL

## 2012-11-29 LAB — CK TOTAL AND CKMB (NOT AT ARMC): CK-MB: 0.8 ng/mL (ref 0.5–3.6)

## 2013-01-10 ENCOUNTER — Emergency Department: Payer: Self-pay | Admitting: Emergency Medicine

## 2013-01-16 ENCOUNTER — Emergency Department: Payer: Self-pay | Admitting: Emergency Medicine

## 2013-02-13 ENCOUNTER — Telehealth: Payer: Self-pay

## 2013-02-13 NOTE — Telephone Encounter (Signed)
Is pt cleared for EGD with propofol?

## 2013-02-14 NOTE — Telephone Encounter (Signed)
Certain there is a risk with any procedure Propofol would require anesthesia present If no recent angina, would be acceptable risk

## 2013-02-15 NOTE — Telephone Encounter (Signed)
lmtcb

## 2013-02-15 NOTE — Telephone Encounter (Signed)
Pt called back He denies further CP/angina symptoms Will fax clearance

## 2013-03-01 ENCOUNTER — Ambulatory Visit: Payer: Self-pay | Admitting: Gastroenterology

## 2013-04-27 ENCOUNTER — Ambulatory Visit: Payer: Medicare Other | Admitting: Cardiovascular Disease

## 2013-05-08 ENCOUNTER — Telehealth: Payer: Self-pay

## 2013-05-08 NOTE — Telephone Encounter (Signed)
TEE done at [facility] 11/18/11. No recent Echo results noted.  Would need to call [facility] for copy of results.  LMOM TCB.

## 2013-05-08 NOTE — Telephone Encounter (Signed)
Needs results of pt echo, or may fax report [phone] att cassandra dillard

## 2013-05-09 NOTE — Telephone Encounter (Signed)
I spoke with Cassandra from [facility]. I explained no echo report on file, but Dr. Mariah Milling had documented in his last office note from 10/17/12 that the patient had mild aortic stenosis, moderate LVH, EF 45 %. Last lipid values from 03/24/12 were also given to Cassandra.

## 2013-05-15 ENCOUNTER — Encounter: Payer: Self-pay | Admitting: Cardiovascular Disease

## 2013-05-15 ENCOUNTER — Ambulatory Visit (INDEPENDENT_AMBULATORY_CARE_PROVIDER_SITE_OTHER): Payer: Medicare Other | Admitting: Cardiovascular Disease

## 2013-05-15 VITALS — BP 120/70 | HR 77 | Ht 65.0 in | Wt 146.8 lb

## 2013-05-15 DIAGNOSIS — I359 Nonrheumatic aortic valve disorder, unspecified: Secondary | ICD-10-CM

## 2013-05-15 DIAGNOSIS — E785 Hyperlipidemia, unspecified: Secondary | ICD-10-CM

## 2013-05-15 DIAGNOSIS — I10 Essential (primary) hypertension: Secondary | ICD-10-CM

## 2013-05-15 DIAGNOSIS — I2510 Atherosclerotic heart disease of native coronary artery without angina pectoris: Secondary | ICD-10-CM

## 2013-05-15 DIAGNOSIS — E119 Type 2 diabetes mellitus without complications: Secondary | ICD-10-CM

## 2013-05-15 DIAGNOSIS — I350 Nonrheumatic aortic (valve) stenosis: Secondary | ICD-10-CM

## 2013-05-15 NOTE — Assessment & Plan Note (Signed)
Currently not on a statin. Most recent cholesterol of 100.

## 2013-05-15 NOTE — Assessment & Plan Note (Signed)
Blood pressure is well controlled on today's visit. No changes made to the medications. 

## 2013-05-15 NOTE — Assessment & Plan Note (Signed)
We have encouraged continued exercise, careful diet management   

## 2013-05-15 NOTE — Patient Instructions (Addendum)
You are doing well. No medication changes were made.  Call the office for worsening shortness of breath or chest pain  Please call us if you have new issues that need to be addressed before your next appt.  Your physician wants you to follow-up in: 6 months.  You will receive a reminder letter in the mail two months in advance. If you don't receive a letter, please call our office to schedule the follow-up appointment.

## 2013-05-15 NOTE — Progress Notes (Signed)
Patient ID: [name], male    DOB: 12-11-1925, 79 y.o.   MRN: [id number]  HPI Comments: Mr. [name] is a pleasant 79 year old gentleman with coronary artery disease, history of MI, bypass x5 in 2002, hypertension, hyperlipidemia, diabetes, prostate cancer, peptic ulcer disease who presented to [facility] on 11/18/2011 with shortness of breath for one month, orthopnea, PND. He presents for routine followup.  Mild aortic valve stenosis in 2013   Previous stress test January 2012 showed no ischemia Echocardiogram done in the hospital February 2013 showed at least mild aortic valve stenosis, moderate LVH, ejection fraction 45% Chest x-ray suggested COPD with CHF He had gentle diuresis through his hospital course with improvement of his symptoms.  Previously had chronic diarrhea. takes Imodium twice a day with mild improvement of his symptoms. No recent chest pain.  He was losing weight, but up 10 or 12 pounds since January 2014 .  He denies any significant lower external edema (trace edema of the right lower extremity), cough, PND or orthopnea.  EKG today shows normal sinus rhythm with rate 77 beats per minute, old anterior MI, left anterior fascicular block  Most recent blood work shows total cholesterol 99, LDL 56 He's not taking cholesterol medication  Outpatient Encounter Prescriptions as of 05/15/2013  Medication Sig Dispense Refill  . ALPRAZolam (XANAX) 0.25 MG tablet Take 0.25 mg by mouth every 8 (eight) hours as needed.        Marland Kitchen amLODipine (NORVASC) 5 MG tablet Take 5 mg by mouth daily.       Marland Kitchen aspirin 81 MG tablet Take 81 mg by mouth daily.      . carvedilol (COREG) 25 MG tablet Take 25 mg by mouth 2 (two) times daily with a meal.      . furosemide (LASIX) 40 MG tablet Take 40 mg by mouth daily as needed.       . Loperamide HCl (IMODIUM A-D) 2 MG CHEW Chew by mouth as needed.       Marland Kitchen losartan (COZAAR) 100 MG tablet Take 100 mg by mouth daily.      . nitroGLYCERIN (NITROSTAT) 0.4 MG SL  tablet Place 0.4 mg under the tongue every 5 (five) minutes as needed.        Marland Kitchen omeprazole (PRILOSEC) 40 MG capsule Take 40 mg by mouth daily.        . ondansetron (ZOFRAN) 4 MG tablet Take 4 mg by mouth every 8 (eight) hours as needed.      . saccharomyces boulardii (FLORASTOR) 250 MG capsule Take 250 mg by mouth daily.          Review of Systems  HENT: Negative.   Eyes: Negative.   Respiratory: Negative.   Musculoskeletal: Negative.   Skin: Negative.   Neurological: Negative.   Psychiatric/Behavioral: Negative.   All other systems reviewed and are negative.    BP 120/70  Pulse 77  Ht 5\' 5"  (1.651 m)  Wt 146 lb 12 oz (66.565 kg)  BMI 24.42 kg/m2  Physical Exam  Nursing note and vitals reviewed. Constitutional: He is oriented to person, place, and time. He appears well-developed and well-nourished.  HENT:  Head: Normocephalic.  Nose: Nose normal.  Mouth/Throat: Oropharynx is clear and moist.  Eyes: Conjunctivae are normal. Pupils are equal, round, and reactive to light.  Neck: Normal range of motion. Neck supple. No JVD present.  Cardiovascular: Normal rate, regular rhythm, S1 normal, S2 normal and intact distal pulses.  Exam reveals no gallop and  no friction rub.   Murmur heard.  Crescendo systolic murmur is present with a grade of 2/6  Pulmonary/Chest: Effort normal and breath sounds normal. No respiratory distress. He has no wheezes. He has no rales. He exhibits no tenderness.  Abdominal: Soft. Bowel sounds are normal. He exhibits no distension. There is no tenderness.  Musculoskeletal: Normal range of motion. He exhibits no edema and no tenderness.  Lymphadenopathy:    He has no cervical adenopathy.  Neurological: He is alert and oriented to person, place, and time. Coordination normal.  Skin: Skin is warm and dry. No rash noted. No erythema.  Psychiatric: He has a normal mood and affect. His behavior is normal. Judgment and thought content normal.      Assessment  and Plan

## 2013-05-15 NOTE — Assessment & Plan Note (Addendum)
Currently with no symptoms of angina. No further workup at this time. Continue current medication regimen. 

## 2013-05-15 NOTE — Assessment & Plan Note (Signed)
Mild AS. No further testing at this time

## 2013-05-17 ENCOUNTER — Observation Stay: Payer: Self-pay | Admitting: Internal Medicine

## 2013-05-17 LAB — CK TOTAL AND CKMB (NOT AT ARMC)
CK, Total: 37 U/L (ref 35–232)
CK-MB: 2 ng/mL (ref 0.5–3.6)

## 2013-05-17 LAB — CBC
HCT: 33.8 % — ABNORMAL LOW (ref 40.0–52.0)
Platelet: 339 10*3/uL (ref 150–440)
RDW: 15.1 % — ABNORMAL HIGH (ref 11.5–14.5)

## 2013-05-17 LAB — BASIC METABOLIC PANEL
BUN: 20 mg/dL — ABNORMAL HIGH (ref 7–18)
Calcium, Total: 9.6 mg/dL (ref 8.5–10.1)
Chloride: 110 mmol/L — ABNORMAL HIGH (ref 98–107)
Co2: 22 mmol/L (ref 21–32)
EGFR (African American): >60
EGFR (Non-African Amer.): 57 — ABNORMAL LOW
Osmolality: 284 (ref 275–301)
Potassium: 4.3 mmol/L (ref 3.5–5.1)
Sodium: 140 mmol/L (ref 136–145)

## 2013-05-17 LAB — TROPONIN I: Troponin-I: 0.07 ng/mL — ABNORMAL HIGH

## 2013-05-18 LAB — CK TOTAL AND CKMB (NOT AT ARMC)
CK, Total: 29 U/L — ABNORMAL LOW (ref 35–232)
CK-MB: 1.1 ng/mL (ref 0.5–3.6)

## 2013-05-18 LAB — CBC WITH DIFFERENTIAL/PLATELET
Eosinophil #: 0 10*3/uL (ref 0.0–0.7)
Eosinophil %: 0.4 %
Lymphocyte #: 0.5 10*3/uL — ABNORMAL LOW (ref 1.0–3.6)
MCH: 28.8 pg (ref 26.0–34.0)
MCV: 85 fL (ref 80–100)
Neutrophil #: 9.8 10*3/uL — ABNORMAL HIGH (ref 1.4–6.5)
Platelet: 285 10*3/uL (ref 150–440)
RBC: 3.77 10*6/uL — ABNORMAL LOW (ref 4.40–5.90)

## 2013-05-18 LAB — BASIC METABOLIC PANEL
BUN: 22 mg/dL — ABNORMAL HIGH (ref 7–18)
Calcium, Total: 8.8 mg/dL (ref 8.5–10.1)
Chloride: 110 mmol/L — ABNORMAL HIGH (ref 98–107)
Co2: 20 mmol/L — ABNORMAL LOW (ref 21–32)
Creatinine: 1.2 mg/dL (ref 0.60–1.30)
EGFR (African American): >60
Osmolality: 280 (ref 275–301)
Sodium: 137 mmol/L (ref 136–145)

## 2013-05-18 LAB — TROPONIN I: Troponin-I: 0.1 ng/mL — ABNORMAL HIGH

## 2013-11-06 ENCOUNTER — Ambulatory Visit (INDEPENDENT_AMBULATORY_CARE_PROVIDER_SITE_OTHER): Payer: Medicare Other | Admitting: Cardiovascular Disease

## 2013-11-06 ENCOUNTER — Encounter: Payer: Self-pay | Admitting: Cardiovascular Disease

## 2013-11-06 VITALS — BP 130/70 | HR 76 | Ht 66.0 in | Wt 144.5 lb

## 2013-11-06 DIAGNOSIS — I359 Nonrheumatic aortic valve disorder, unspecified: Secondary | ICD-10-CM

## 2013-11-06 DIAGNOSIS — I10 Essential (primary) hypertension: Secondary | ICD-10-CM

## 2013-11-06 DIAGNOSIS — R5383 Other fatigue: Secondary | ICD-10-CM

## 2013-11-06 DIAGNOSIS — R5381 Other malaise: Secondary | ICD-10-CM

## 2013-11-06 DIAGNOSIS — R079 Chest pain, unspecified: Secondary | ICD-10-CM

## 2013-11-06 DIAGNOSIS — R011 Cardiac murmur, unspecified: Secondary | ICD-10-CM

## 2013-11-06 DIAGNOSIS — I2510 Atherosclerotic heart disease of native coronary artery without angina pectoris: Secondary | ICD-10-CM

## 2013-11-06 DIAGNOSIS — I350 Nonrheumatic aortic (valve) stenosis: Secondary | ICD-10-CM

## 2013-11-06 NOTE — Progress Notes (Signed)
Patient ID: [name], male    DOB: [birth year], 80 y.o.   MRN: [id number]  HPI Comments: [name] is a pleasant 80 year old gentleman with coronary artery disease, history of MI, bypass x5 in 2002, hypertension, hyperlipidemia, diabetes, prostate cancer, peptic ulcer disease who presented to [facility] on 11/18/2011 with shortness of breath for one month, orthopnea, PND. He presents for routine followup.  Mild aortic valve stenosis in 2013 Previous problems with chronic diarrhea   Previous stress test January 2012 showed no ischemia Echocardiogram done in the hospital February 2013 showed at least mild aortic valve stenosis, moderate LVH, ejection fraction 45% Chest x-ray suggested COPD with CHF He had gentle diuresis through his hospital course with improvement of his symptoms.  In followup today, he reports that he is doing well. Weight is increased on Megace. August 2014 he had episode of chest pain requiring nitroglycerin. Cardiac workup was negative and it was felt symptoms secondary to underlying urinary tract infection.  was previously on a statin but this was held secondary to leg weakness. He thinks he was Lipitor No recent episodes of chest pain  He denies any significant lower external edema (trace edema of the right lower extremity), cough, PND or orthopnea.  EKG today shows normal sinus rhythm with rate 76 beats per minute, old anterior MI, left anterior fascicular block  Previous total cholesterol 99, LDL 56 He's not taking cholesterol medication  Outpatient Encounter Prescriptions as of 11/06/2013  Medication Sig  . ALPRAZolam (XANAX) 0.25 MG tablet Take 0.25 mg by mouth every 8 (eight) hours as needed.    Marland Kitchen amLODipine (NORVASC) 5 MG tablet Take 5 mg by mouth daily.   Marland Kitchen aspirin 81 MG tablet Take 81 mg by mouth daily.  . carvedilol (COREG) 25 MG tablet Take 25 mg by mouth 2 (two) times daily with a meal.  . furosemide (LASIX) 40 MG tablet Take 40 mg by mouth daily as  needed.   . Loperamide HCl (IMODIUM A-D) 2 MG CHEW Chew by mouth as needed.   Marland Kitchen losartan (COZAAR) 100 MG tablet Take 100 mg by mouth daily.  . nitroGLYCERIN (NITROSTAT) 0.4 MG SL tablet Place 0.4 mg under the tongue every 5 (five) minutes as needed.    Marland Kitchen omeprazole (PRILOSEC) 40 MG capsule Take 40 mg by mouth daily.    . ondansetron (ZOFRAN) 4 MG tablet Take 4 mg by mouth every 8 (eight) hours as needed.  . saccharomyces boulardii (FLORASTOR) 250 MG capsule Take 250 mg by mouth daily.    . vitamin B-12 (CYANOCOBALAMIN) 1000 MCG tablet Take 2,000 mcg by mouth daily.     Review of Systems  Constitutional: Negative.   HENT: Negative.   Eyes: Negative.   Respiratory: Negative.   Cardiovascular: Negative.   Gastrointestinal: Negative.   Endocrine: Negative.   Musculoskeletal: Negative.   Skin: Negative.   Allergic/Immunologic: Negative.   Neurological: Negative.   Hematological: Negative.   Psychiatric/Behavioral: Negative.   All other systems reviewed and are negative.    BP 130/70  Pulse 76  Ht 5\' 6"  (1.676 m)  Wt 144 lb 8 oz (65.545 kg)  BMI 23.33 kg/m2  Physical Exam  Nursing note and vitals reviewed. Constitutional: He is oriented to person, place, and time. He appears well-developed and well-nourished.  HENT:  Head: Normocephalic.  Nose: Nose normal.  Mouth/Throat: Oropharynx is clear and moist.  Eyes: Conjunctivae are normal. Pupils are equal, round, and reactive to light.  Neck: Normal range of motion.  Neck supple. No JVD present.  Cardiovascular: Normal rate, regular rhythm, S1 normal, S2 normal and intact distal pulses.  Exam reveals no gallop and no friction rub.   Murmur heard.  Crescendo systolic murmur is present with a grade of 2/6  Pulmonary/Chest: Effort normal and breath sounds normal. No respiratory distress. He has no wheezes. He has no rales. He exhibits no tenderness.  Abdominal: Soft. Bowel sounds are normal. He exhibits no distension. There is no  tenderness.  Musculoskeletal: Normal range of motion. He exhibits no edema and no tenderness.  Lymphadenopathy:    He has no cervical adenopathy.  Neurological: He is alert and oriented to person, place, and time. Coordination normal.  Skin: Skin is warm and dry. No rash noted. No erythema.  Psychiatric: He has a normal mood and affect. His behavior is normal. Judgment and thought content normal.      Assessment and Plan

## 2013-11-06 NOTE — Assessment & Plan Note (Signed)
Known mild aortic valve stenosis. We'll track with periodic echocardiography. Currently with no symptoms

## 2013-11-06 NOTE — Assessment & Plan Note (Signed)
No recent episodes of chest pain concerning for angina 

## 2013-11-06 NOTE — Patient Instructions (Signed)
You are doing well. No medication changes were made.  Please call us if you have new issues that need to be addressed before your next appt.  Your physician wants you to follow-up in: 6 months.  You will receive a reminder letter in the mail two months in advance. If you don't receive a letter, please call our office to schedule the follow-up appointment.   

## 2013-11-06 NOTE — Assessment & Plan Note (Signed)
Currently with no symptoms of angina. No further workup at this time. Continue current medication regimen. He does not want a statin given previous weakness

## 2013-11-06 NOTE — Assessment & Plan Note (Signed)
Blood pressure is well controlled on today's visit. No changes made to the medications. 

## 2014-07-05 ENCOUNTER — Encounter: Payer: Self-pay | Admitting: Cardiovascular Disease

## 2014-07-05 ENCOUNTER — Ambulatory Visit (INDEPENDENT_AMBULATORY_CARE_PROVIDER_SITE_OTHER): Payer: Medicare Other | Admitting: Cardiovascular Disease

## 2014-07-05 VITALS — BP 118/70 | HR 76 | Ht 66.0 in | Wt 144.0 lb

## 2014-07-05 DIAGNOSIS — N390 Urinary tract infection, site not specified: Secondary | ICD-10-CM | POA: Insufficient documentation

## 2014-07-05 DIAGNOSIS — I10 Essential (primary) hypertension: Secondary | ICD-10-CM

## 2014-07-05 DIAGNOSIS — I2510 Atherosclerotic heart disease of native coronary artery without angina pectoris: Secondary | ICD-10-CM

## 2014-07-05 DIAGNOSIS — N3000 Acute cystitis without hematuria: Secondary | ICD-10-CM

## 2014-07-05 DIAGNOSIS — I350 Nonrheumatic aortic (valve) stenosis: Secondary | ICD-10-CM

## 2014-07-05 DIAGNOSIS — E785 Hyperlipidemia, unspecified: Secondary | ICD-10-CM

## 2014-07-05 DIAGNOSIS — I359 Nonrheumatic aortic valve disorder, unspecified: Secondary | ICD-10-CM

## 2014-07-05 NOTE — Progress Notes (Signed)
Patient ID: [name], male    DOB: 07/07/1926, 80 y.o.   MRN: [id number]  HPI Comments: [name] is a pleasant 80 year old gentleman with coronary artery disease, history of MI, bypass x5 in 2002, hypertension, hyperlipidemia, diabetes, prostate cancer, peptic ulcer disease who presented to [facility] on 11/18/2011 with shortness of breath for one month, orthopnea, PND. He presents for routine followup.  Mild aortic valve stenosis in 2013 Previous problems with chronic diarrhea   Previous stress test January 2012 showed no ischemia Echocardiogram done in the hospital February 2013 showed at least mild aortic valve stenosis, moderate LVH, ejection fraction 45% Chest x-ray suggested COPD with CHF He had gentle diuresis through his hospital course with improvement of his symptoms.  In followup today, he reports that he is doing well.  He continues on  Megace for his weight Diagnosed with urinary tract infection. He will pick up ciprofloxacin today Some foul-smelling urine, no significant weakness He is walking with a cane. No recent falls. Wife reports that his strength is not as good in general. He stopped his statin one year ago. Total cholesterol previously 99, most recently in July was 132 No recent chest pain concerning for angina He denies any significant lower external edema (trace edema of the right lower extremity), cough, PND or orthopnea.  August 2014 he had episode of chest pain requiring nitroglycerin. Cardiac workup was negative and it was felt symptoms secondary to underlying urinary tract infection.  EKG today shows normal sinus rhythm with rate 76 beats per minute, old anterior MI, left anterior fascicular block. There is T-wave abnormality in V6, aVF  Previous total cholesterol 99, LDL 56 He's not taking cholesterol medication  Outpatient Encounter Prescriptions as of 07/05/2014  Medication Sig  . ALPRAZolam (XANAX) 0.25 MG tablet Take 0.25 mg by mouth every 8 (eight)  hours as needed.    Marland Kitchen amLODipine (NORVASC) 5 MG tablet Take 5 mg by mouth daily.   Marland Kitchen aspirin 81 MG tablet Take 81 mg by mouth daily.  . carvedilol (COREG) 25 MG tablet Take 25 mg by mouth 2 (two) times daily with a meal.  . ciprofloxacin (CIPRO) 500 MG tablet Take by mouth.  . furosemide (LASIX) 40 MG tablet Take 40 mg by mouth daily as needed.   . Loperamide HCl (IMODIUM A-D) 2 MG CHEW Chew by mouth as needed.   Marland Kitchen losartan (COZAAR) 100 MG tablet Take 100 mg by mouth daily.  . megestrol (MEGACE) 400 MG/10ML suspension Take 400 mg by mouth daily. As needed for poor appetite  . nitroGLYCERIN (NITROSTAT) 0.4 MG SL tablet Place 0.4 mg under the tongue every 5 (five) minutes as needed.    Marland Kitchen omeprazole (PRILOSEC) 40 MG capsule Take 40 mg by mouth daily.    . ondansetron (ZOFRAN) 4 MG tablet Take 4 mg by mouth every 8 (eight) hours as needed.  . saccharomyces boulardii (FLORASTOR) 250 MG capsule Take 250 mg by mouth daily.    . vitamin B-12 (CYANOCOBALAMIN) 1000 MCG tablet Take 2,000 mcg by mouth daily.     Review of Systems  Constitutional: Negative.   HENT: Negative.   Eyes: Negative.   Respiratory: Negative.   Cardiovascular: Negative.   Gastrointestinal: Negative.   Endocrine: Negative.   Genitourinary:       Foul-smelling urine  Musculoskeletal: Positive for gait problem.  Skin: Negative.   Allergic/Immunologic: Negative.   Neurological: Negative.   Hematological: Negative.   Psychiatric/Behavioral: Negative.   All other systems reviewed  and are negative.   BP 118/70  Pulse 76  Ht 5\' 6"  (1.676 m)  Wt 144 lb (65.318 kg)  BMI 23.25 kg/m2  Physical Exam  Nursing note and vitals reviewed. Constitutional: He is oriented to person, place, and time. He appears well-developed and well-nourished.  HENT:  Head: Normocephalic.  Nose: Nose normal.  Mouth/Throat: Oropharynx is clear and moist.  Eyes: Conjunctivae are normal. Pupils are equal, round, and reactive to light.  Neck:  Normal range of motion. Neck supple. No JVD present.  Cardiovascular: Normal rate, regular rhythm, S1 normal, S2 normal and intact distal pulses.  Exam reveals no gallop and no friction rub.   Murmur heard.  Crescendo systolic murmur is present with a grade of 2/6  Pulmonary/Chest: Effort normal and breath sounds normal. No respiratory distress. He has no wheezes. He has no rales. He exhibits no tenderness.  Abdominal: Soft. Bowel sounds are normal. He exhibits no distension. There is no tenderness.  Musculoskeletal: Normal range of motion. He exhibits no edema and no tenderness.  Lymphadenopathy:    He has no cervical adenopathy.  Neurological: He is alert and oriented to person, place, and time. Coordination normal.  Skin: Skin is warm and dry. No rash noted. No erythema.  Psychiatric: He has a normal mood and affect. His behavior is normal. Judgment and thought content normal.      Assessment and Plan

## 2014-07-05 NOTE — Assessment & Plan Note (Signed)
Blood pressure is well controlled on today's visit. No changes made to the medications. 

## 2014-07-05 NOTE — Assessment & Plan Note (Signed)
Not on a statin. Total cholesterol 135

## 2014-07-05 NOTE — Assessment & Plan Note (Signed)
Recently seen by Dr. Doy Hutching. He will pick up a prescription for ciprofloxacin. We have recommended if symptoms do not improve after the weekend, that he contact Dr. Doy Hutching.

## 2014-07-05 NOTE — Assessment & Plan Note (Signed)
Currently with no symptoms of angina. No further workup at this time. Continue current medication regimen. 

## 2014-07-05 NOTE — Patient Instructions (Signed)
You are doing well. No medication changes were made.  Please call us if you have new issues that need to be addressed before your next appt.  Your physician wants you to follow-up in: 6 months.  You will receive a reminder letter in the mail two months in advance. If you don't receive a letter, please call our office to schedule the follow-up appointment.   

## 2014-07-05 NOTE — Assessment & Plan Note (Signed)
Mild aortic valve stenosis in 2013. No further workup at this time

## 2014-11-13 ENCOUNTER — Emergency Department: Payer: Self-pay | Admitting: Emergency Medicine

## 2014-11-13 LAB — COMPREHENSIVE METABOLIC PANEL
ALBUMIN: 3.3 g/dL — AB (ref 3.4–5.0)
ALK PHOS: 151 U/L — AB (ref 46–116)
Anion Gap: 10 (ref 7–16)
BUN: 26 mg/dL — ABNORMAL HIGH (ref 7–18)
Bilirubin,Total: 0.7 mg/dL (ref 0.2–1.0)
CALCIUM: 9.8 mg/dL (ref 8.5–10.1)
CO2: 23 mmol/L (ref 21–32)
Chloride: 109 mmol/L — ABNORMAL HIGH (ref 98–107)
Creatinine: 1.57 mg/dL — ABNORMAL HIGH (ref 0.60–1.30)
EGFR (African American): 54 — ABNORMAL LOW
EGFR (Non-African Amer.): 45 — ABNORMAL LOW
GLUCOSE: 165 mg/dL — AB (ref 65–99)
Osmolality: 292 (ref 275–301)
Potassium: 4.7 mmol/L (ref 3.5–5.1)
SGOT(AST): 44 U/L — ABNORMAL HIGH (ref 15–37)
SGPT (ALT): 47 U/L (ref 14–63)
Sodium: 142 mmol/L (ref 136–145)
Total Protein: 7.7 g/dL (ref 6.4–8.2)

## 2014-11-13 LAB — CBC
HCT: 40.7 % (ref 40.0–52.0)
HGB: 13.2 g/dL (ref 13.0–18.0)
MCH: 28.5 pg (ref 26.0–34.0)
MCHC: 32.4 g/dL (ref 32.0–36.0)
MCV: 88 fL (ref 80–100)
Platelet: 268 10*3/uL (ref 150–440)
RBC: 4.62 10*6/uL (ref 4.40–5.90)
RDW: 16.1 % — ABNORMAL HIGH (ref 11.5–14.5)
WBC: 10.4 10*3/uL (ref 3.8–10.6)

## 2014-11-13 LAB — TROPONIN I: Troponin-I: 0.07 ng/mL — ABNORMAL HIGH

## 2015-02-01 NOTE — H&P (Signed)
PATIENT NAME:  [name], [name] MR#:  [id number] DATE OF BIRTH:  29-Sep-1926  DATE OF ADMISSION:  05/17/2013  PRIMARY CARE PHYSICIAN: Dr. Fulton Reek at [place]: Esmond Plants at [facility].   CHIEF COMPLAINT: Chest pain.   HISTORY OF PRESENT ILLNESS: This is an 81 year old male who presented to the Emergency Room with complaints of midsternal chest pain that started around noon today while at rest and lasted for several minutes now seems to be radiating to the abdomen is currently a 1 out of 10. States that while he was at home, he took 3 doses of nitroglycerin, showed some improvement, but no significant improvement. He does not think it is his heart; but because of the chest discomfort, he proceeded to come to the Emergency Room even after seeing Dr. Rockey Situ on Monday with a good checkup. While in the ER, he was given a nitroglycerin patch and currently is in no distress. He was seen by the Emergency Room physician, noted to have no significant changes on EKG with slightly elevated troponin 0.07 and normal CK-MB.  I was called to admit the patient for cardiac rule out.   PAST MEDICAL HISTORY: The patient has noted that he has a history of coronary artery disease, status post coronary artery bypass graft, history of myocardial infarction, history of hypertension, history of type 2 diabetes, history of prostate cancer.   MEDICATIONS:  1. Ferrous gluconate as directed.  2. Coreg 12.5 two tabs twice a day.  3. Xanax 0.25 one tab q. 8 hours as needed.  4. Losartan 100 mg p.o. daily.  5. Nitroglycerin 0.4 mg q. 5 minutes as needed.  6. Aspirin 81 mg p.o. daily.  7. Omeprazole 20 mg p.o. daily.  8. Amlodipine 5 mg p.o. daily. 9. Ondansetron 4 mg as needed for nausea and vomiting.  10. Megace 20 mL by mouth every morning for weight gain. 11. Lasix 1 tab as needed.   ALLERGIES: PENICILLIN, IV DYE.   SOCIAL HISTORY: Denies any alcohol, tobacco use.   REVIEW OF  SYSTEMS: Denies any nausea, vomiting, or radiating pain anywhere, and no dyspnea; had some history of altered mental status yesterday, which has resolved today.   PHYSICAL EXAMINATION:  VITAL SIGNS: Blood pressure 172/82, pulse 96, satting 100% on 2 liters.   GENERAL: Non-ill-appearing, elderly, white male alert and oriented x 3. No apparent distress.   HEENT: Extraocular movements intact. Vision grossly intact.   NECK: Supple neck moist mucous membranes.   NECK: Supple.   CARDIOVASCULAR: Regular rate and rhythm. Does have a 2/6 systolic murmur.   RESPIRATORY: Clear to auscultation bilaterally. No wheezing. No increased work of breathing.   ABDOMEN: Soft, nontender, nondistended. No rebound or guarding.   NEUROLOGIC:  Cranial nerves II through XII grossly intact, 5/5 strength throughout. Sensation grossly intact.   SKIN: Normal color. Normal turgor. No cyanosis.   PERTINENT STUDIES: EKG showed no acute changes compared to previous EKG. Normal sinus rhythm. Troponin 0.07 CK-MB of 2. White blood cell count 12.4, hemoglobin 11.4, platelets 339. Sodium 140, potassium 4.3, creatinine 1.17, glucose 139.   ASSESSMENT AND PLAN: 1. Chest pain: The patient initially admitted for atypical chest pain. Currently, radiating around the abdomen and chest, a low suspicion for acute coronary syndrome. I did speak to Dr. Esmond Plants with [facility]. He is aware of the patient's condition and is also not thinking that this is likely cardiac in nature. No catheterization or intervention at this time. What  we will do is admit the patient on telemetry and cycle his cardiac enzymes. Continue with the nitroglycerin, continue the beta blocker and the ARB at this time. We will increase the aspirin to 325.  2. Accelerated hypertension. The patient's baseline is usually around 206 systolic.  He is currently in the 160s to 170s, likely stress induced. Will continue with the current regimen with the  nitroglycerin patch. If he continues to stay above 180, we will give him hydralazine 10 mg IV every 4 to 6 hours as needed. Other chronic medical issues remained stable. Continue on his home regimen.   DISPOSITION: He is going to be placed on observation on telemetry floor. I suspect a short hospital stay evaluating his cardiac enzymes.    ____________________________ Dion Body, MD kl:rw D: 05/17/2013 18:37:02 ET T: 05/17/2013 19:13:03 ET JOB#: [id number]  cc: Dion Body, MD, <Dictator> Dion Body MD ELECTRONICALLY SIGNED 05/29/2013 12:43

## 2015-02-03 NOTE — Consult Note (Signed)
General Aspect 81 year old gentleman with history of coronary artery disease status post MI and five vessel bypass, hypertension, hyperlipidemia, diabetes, prostate cancer, and peptic ulcer disease who presents with complaints of one month of SOB when lying flat. Cardiology was consulted for CHF and SOB.    EKG performed by Dr. Doy Hutching and he was sent to the ER.   Denies any chest pain, palpitations, presyncope, or syncope. He endorses orthopnea and PND. No lower extremity swelling. He reports a 5 pound weight gain. He does report his BP has been elevated at home, with SBP in the 170 range. He though this was not too bad.    Present Illness . FAMILY HISTORY: Positive for hypertension and high cholesterol.   SOCIAL HISTORY: Nonsmoker and nondrinker.  Nuclear Study:  11/06/2010:   Exercise Capacity: Lexiscan with no exercise. BP Response: Normal blood pressure response. Clinical Symptoms: No chest pain ECG Impression: No significant ST segment change suggestive of ischemia. Overall Impression: Normal stress nuclear study. No wall motion abnormalities.  ECHO in 10/2010 with normal EF, Aortic Valve area: 0.86cm 2 (Vmax).  Mean gradient: 23m Hg (S). Peak gradient: 243mHg (S).   Physical Exam:   GEN WD, WN, NAD, thin    HEENT red conjunctivae    NECK supple    RESP normal resp effort  dull at the bases, left greater than right    CARD Regular rate and rhythm  Murmur    Murmur Systolic    Systolic Murmur Out flow    ABD denies tenderness  soft    LYMPH negative neck    SKIN normal to palpation    NEURO cranial nerves intact, motor/sensory function intact    PSYCH alert, A+O to time, place, person, good insight   Review of Systems:   Subjective/Chief Complaint SOB with lying flat x 1 month    General: No Complaints    Skin: No Complaints    ENT: No Complaints    Eyes: No Complaints    Neck: No Complaints    Respiratory: Short of breath    Cardiovascular:  Dyspnea    Gastrointestinal: No Complaints    Genitourinary: No Complaints    Vascular: No Complaints    Musculoskeletal: No Complaints    Neurologic: No Complaints    Hematologic: No Complaints    Endocrine: No Complaints    Psychiatric: No Complaints    Review of Systems: All other systems were reviewed and found to be negative    Medications/Allergies Reviewed Medications/Allergies reviewed     mi:    prostate cancer:    diabetes:    htn:    colonoscopy:    cardiac cath:    esophagus stretched:    hernia repair:    Tonsillectomy:    left shoulder:    left hip replacement:    parathyroid:    cardiac bypass x 5:        Admit Diagnosis:   CHF: 18-Nov-2011, Active, CHF      Admit Reason:   CHF (congestive heart failure): (428.0) Active, ICD9, Congestive heart failure, unspecified  Home Medications: Medication Instructions Status  alprazolam 0.25 mg oral tablet 1  orally every 8 hours as needed   Active  nitroglycerin 0.4 mg sublingual tablet 1  sublingual  as needed  [id number] 3 doses if no relief call 911 Active  metformin 500 mg oral tablet 1  orally 2 times a day  Active  ferrous gluconate 324 mg oral tablet 1  orally every other day  Active  Florastor 250 mg daily 1   once a day  Active  Cipro 500 mg oral tablet 1  orally 2 times a day  Active  simvastatin twice weekly  Active  aspirin 325 mg oral tablet 1  orally every 48 hours  Active  Vitamin D3  Active  cinnamon 500 mg oral capsule 1  orally once a day  Active  ondansetron 4 mg oral tablet 1  orally every 6 hours as needed   Active  carvedilol 25 mg oral tablet 1 tab(s) orally 2 times a day  Active  Hyzaar 100 mg-12.5 mg oral tablet 1  orally once a day  Active  Ambien 10 mg oral tablet 1  orally once a day (at bedtime) as needed   Active  omeprazole 40 mg oral delayed release capsule 1  orally once a day  Active     Routine Hem:  05-Feb-13 16:55    WBC (CBC) 8.1   RBC (CBC) 4.21    Hemoglobin (CBC) 11.9   Hematocrit (CBC) 36.2   Platelet Count (CBC) 311   MCV 86   MCH 28.3   MCHC 32.8   RDW 15.8  Routine Chem:  05-Feb-13 16:55    Glucose, Serum 123   BUN 14   Creatinine (comp) 0.87   Sodium, Serum 142   Potassium, Serum 4.2   Chloride, Serum 107   CO2, Serum 24   Calcium (Total), Serum 8.4  Hepatic:  05-Feb-13 16:55    Bilirubin, Total 0.3   Alkaline Phosphatase 101   SGPT (ALT) 15   SGOT (AST) 23   Total Protein, Serum 6.7   Albumin, Serum 2.7  Routine Chem:  05-Feb-13 16:55    Osmolality (calc) 285   eGFR (African American) >60   eGFR (Non-African American) >60   Anion Gap 11  Cardiac:  05-Feb-13 16:55    Troponin I 0.03   CK, Total 38   CPK-MB, Serum 1.2  Routine Chem:  05-Feb-13 16:55    B-Type Natriuretic Peptide [facility]) 6375   EKG:   Interpretation ekg shows NSR with rate of 75 bpm, LBBB, frequent PVC   Radiology Results: XRay:    05-Feb-13 17:11, Chest PA and Lateral   Chest PA and Lateral    REASON FOR EXAM:    sob  COMMENTS:       PROCEDURE: DXR - DXR CHEST PA (OR AP) AND LATERAL  - Nov 17 2011  5:11PM     RESULT: Comparison is made to study of 04 February 2011.    The lungs are mildly hyperinflated. There is subsegmental atelectasis at   the lung bases. A small amount of pleural fluid is present bilaterally in   the costophrenic gutters. The cardiac silhouette is normal in size. The   pulmonary vascularity is not engorged. There is tortuosity of the   descending thoracic aorta. The patient has undergone previous CABG.    IMPRESSION:  There are findings which likely reflect COPD. Superimposed   small bilateral pleural effusions with bibasilar atelectasis are present.     This may be due to low-grade CHF or to other factors. Followup films   following therapy are recommended to assure clearing of the basilar   atelectasis and pleural effusions.          Verified By: DAVID A. Martinique, M.D., MD    Penicillin:  Unknown  IVP Dye: Unknown  Vital Signs/Nurse's Notes: **Vital Signs.:  06-Feb-13 07:37   Vital Signs Type Routine   Temperature Temperature (F) 98.1   Celsius 36.7   Temperature Source oral   Pulse Pulse 83   Pulse source per Dinamap   Systolic BP Systolic BP 638   Diastolic BP (mmHg) Diastolic BP (mmHg) 96   Mean BP 129   BP Source Dinamap   Pulse Ox % Pulse Ox % 96   Pulse Ox Activity Level  At rest   Oxygen Delivery Room Air/ 21 %     Impression 81 year old gentleman with history of coronary artery disease status post MI and five vessel bypass, hypertension, hyperlipidemia, diabetes, aortic valve stenosis, prostate cancer, and peptic ulcer disease who presents with complaints of one month of SOB when lying flat. Cardiology was consulted for CHF and SOB.  A/P: 1) SOB/PND conserning for diastolic CHF Blood pressure elevated, underlying aortic valve stenosis, EF mildly reduced 46% Diastolic dysfunction on echo with moderate LVH High blood pressure at home. --Would continue gentle diuresis (RVSP not particularly elevated on echo) --Would continue outpt meds. --Could add amlodipine 5 to 10 mg for HTN  2) CAD/CABG denies angina enz negative baseline LBBB continue outpt meds.  3) HTN: Continue outpt meds, add norvasc or other agent Continue gentle diuresis Needs BP education  4) Aortic valve stenosis Mildly worse conpared to previous echo Monitor with yearly echo Needs good BP control, lasix PRN once back to baseline (possible QOD)   Electronic Signatures: Ida Rogue (MD)  (Signed 06-Feb-13 09:58)  Authored: General Aspect/Present Illness, History and Physical Exam, Review of System, Past Medical History, Health Issues, Home Medications, Labs, EKG , Radiology, Allergies, Vital Signs/Nurse's Notes, Impression/Plan   Last Updated: 06-Feb-13 09:58 by Ida Rogue (MD)

## 2015-02-03 NOTE — H&P (Signed)
PATIENT NAME:  [name], [name] MR#:  [id number] DATE OF BIRTH:  03-20-1926  DATE OF ADMISSION:  11/18/2011  REFERRING PHYSICIAN: Dr. Lovena Le   PRIMARY CARE PHYSICIAN: Fulton Reek, MD   PRIMARY CARDIOLOGIST: Dr. Rockey Situ   PRESENTING COMPLAINT: The patient was sent over from [facility] after being evaluated for shortness of breath with abnormal EKG.   HISTORY OF PRESENT ILLNESS: [name] is an 81 year old gentleman with history of coronary artery disease status post MI and five vessel bypass, hypertension, hyperlipidemia, diabetes, prostate cancer, and peptic ulcer disease who presented to his primary care physician's office with complaints of one month of waking up from sleep gasping for air or laying down flat. The patient had apparently an EKG performed and Dr. Doy Hutching was concerned with abnormal findings and requested him to be sent to the Emergency Room for further evaluation. Denies any chest pain, palpitations, presyncope, or syncope. He endorses orthopnea and PND. No lower extremity swelling. He reports a 5 pound weight gain that has been gradual since improvement of his diarrhea. He was actually losing weight prior to that.   PAST MEDICAL HISTORY:  1. Hypertension.  2. Hyperlipidemia.  3. Aortic stenosis.  4. Anemia of chronic disease.  5. Dysphagia.  6. Diabetes.  7. Gastroesophageal reflux disease.  8. Coronary artery disease status post MI and five vessel coronary artery bypass graft.  9. Gout.  10. Prostate cancer status post seed implants and radiation therapy.  11. Peptic ulcer disease.  12. Lower GI bleed.  13. Hematuria.  14. Nephrolithiasis.   PAST SURGICAL HISTORY:  1. Orchiectomy.  2. Right inguinal hernia repair.  3. Left hip replacement in 2004.  4. Left shoulder surgery status post fall with requirement for reconstructive surgery after failure of first surgery.  5. Tonsillectomy.  6. Hypercalcemia status post parathyroidectomy.  7. Esophageal dilatation  x2.   ALLERGIES: IV dye and penicillin.   MEDICATIONS:  1. Metformin 1000 mg every morning.  2. Allopurinol 450 mg weekly.  3. Ferrous gluconate 240 mg every other day.  4. Coreg 25 mg b.i.d.  5. Xanax 0.25 mg every eight hours as needed.  6. Simvastatin 40 mg at bedtime.  7. Ambien 10 mg at bedtime as needed.  8. Nitroglycerin sublingual as needed.  9. Prilosec 20 mg daily.  10. Losartan 100 mg daily.  11. Aspirin, enteric-coated, 325 mg every other day.  12. Cinnamon capsule 1 tablet daily.  13. Questran powder 4 grams b.i.d.   FAMILY HISTORY: History of CVA, hypertension, and high cholesterol.   SOCIAL HISTORY: Lives in [place] with his wife. Never smoked or drank. No drugs.   REVIEW OF SYSTEMS: CONSTITUTIONAL: No fevers or chills. He was having weight loss but now is starting to gain back some weight of about 5 pounds. EYES: No changes in vision, glaucoma, cataracts. ENT: No dysphagia. RESPIRATORY: No cough, wheezing, hemoptysis. Reports shortness of breath as per history of present illness. CARDIOVASCULAR: No chest pain. Endorses orthopnea and PND. No edema, palpitations, or syncope. No arrhythmia. GI: No nausea, vomiting, diarrhea, abdominal pain, hematemesis. GU: No dysuria or hematuria. ENDOCRINE: No polyuria or polydipsia. HEME: No easy bleeding. SKIN: No ulcers. MUSCULOSKELETAL: No neck pain or joint swelling. NEUROLOGIC: No one-sided weakness or numbness. PSYCH: Denies any depression or suicidal ideation.   PHYSICAL EXAMINATION:   VITAL SIGNS: Temperature 98.3, pulse 79, respiratory rate 18, blood pressure 194/94, repeat blood pressure 178/83, sating at 97% on room air.   GENERAL: Lying in bed in  no apparent distress.   HEENT: Normocephalic, atraumatic. Pupils are equal, symmetric, nonicteric. Moist mucous membrane.   NECK: Soft and supple. No adenopathy or JVP.   CARDIOVASCULAR: Non-tachy. He has a systolic murmur. No rub or gallop.   LUNGS: Basilar crackles. No  use of accessory muscles or increased respiratory effort.   ABDOMEN: Soft. Positive bowel sounds. No mass appreciated.   EXTREMITIES: Trace edema. Dorsal pedis pulses intact.   MUSCULOSKELETAL: No joint effusion.   SKIN: No ulcers.   NEUROLOGIC: No dysarthria or aphasia. Symmetrical strength. No focal deficits.   PSYCH: He is alert and oriented. The patient is cooperative.   PERTINENT LABS AND STUDIES: Chest x-ray with findings which likely reflect COPD. There are superimposed small bilateral pleural effusions with bibasilar atelectasis. This may be due to low-grade CHF or to other factors. Follow-up films following therapy recommended to assure clearing of the basilar atelectasis and pleural effusions.   WBC 8.1, hemoglobin 11.9, hematocrit 36.2, platelets 311, MCV 86, glucose 123, BUN 14, creatinine 0.87, sodium 142, potassium 4.2, chloride 107, carbon dioxide 24, calcium 8.4, albumin 2.7. LFTs within normal limits. Troponin 0.03. CK 38. MB 1.2. BNP 6375. EKG with sinus rate and PVCs with left bundle branch block and first degree AV block. EKG from [facility] also shows coupling.   ASSESSMENT AND PLAN: [name] is an 81 year old gentleman with history of coronary artery disease status post bypass, hypertension, hyperlipidemia, diabetes, and aortic stenosis sent from [facility] with concerns of abnormal EKG and has been endorsing orthopnea and PND.  1. New onset congestive heart failure with chest x-ray revealing for bilateral pleural effusion and some low-grade CHF. His EKG does show some abnormality but repeat EKG here is unchanged from previous EKGs done on previous admissions. His first troponin is high normal. Will continue tele. Continue to cycle cardiac enzymes. Send TSH, magnesium, and send urinalysis. Will obtain an echocardiogram. Obtain Cardiology consultation. Restart his aspirin daily instead of every other day. Start on nitro paste, oxygen, beta-blocker, statin, and Lasix. Daily  weights, I's and O's. Currently denies any chest pain.  2. Diabetes. Hold metformin. Start on sliding scale insulin.  3. Hypertension, uncontrolled. As above, nitro paste. Restart his beta-blocker and losartan.  4. Gout. The patient is on allopurinol on a weekly basis.  5. Prophylaxis with aspirin, Prilosec, and Lovenox.   TIME SPENT: Approximately 45 minutes spent on patient care.   ____________________________ Rita Ohara, MD ap:drc D: 11/18/2011 02:58:04 ET T: 11/18/2011 07:22:55 ET JOB#: [id number]  cc: Brien Few Clinton Dragone, MD, <Dictator> Leonie Douglas. Doy Hutching, MD Rita Ohara MD ELECTRONICALLY SIGNED 12/08/2011 0:35

## 2015-04-10 ENCOUNTER — Encounter: Payer: Self-pay | Admitting: Medical Oncology

## 2015-04-10 ENCOUNTER — Emergency Department: Payer: Medicare Other

## 2015-04-10 ENCOUNTER — Inpatient Hospital Stay
Admission: EM | Admit: 2015-04-10 | Discharge: 2015-04-15 | DRG: 872 | Disposition: A | Discharge status: Skilled Nursing Facility | Payer: Medicare Other | Attending: Internal Medicine | Admitting: Internal Medicine

## 2015-04-10 DIAGNOSIS — Z96649 Presence of unspecified artificial hip joint: Secondary | ICD-10-CM | POA: Diagnosis present

## 2015-04-10 DIAGNOSIS — E785 Hyperlipidemia, unspecified: Secondary | ICD-10-CM | POA: Diagnosis present

## 2015-04-10 DIAGNOSIS — Z8546 Personal history of malignant neoplasm of prostate: Secondary | ICD-10-CM | POA: Diagnosis not present

## 2015-04-10 DIAGNOSIS — Z79899 Other long term (current) drug therapy: Secondary | ICD-10-CM

## 2015-04-10 DIAGNOSIS — N183 Chronic kidney disease, stage 3 (moderate): Secondary | ICD-10-CM | POA: Diagnosis present

## 2015-04-10 DIAGNOSIS — Z8249 Family history of ischemic heart disease and other diseases of the circulatory system: Secondary | ICD-10-CM

## 2015-04-10 DIAGNOSIS — Z7982 Long term (current) use of aspirin: Secondary | ICD-10-CM

## 2015-04-10 DIAGNOSIS — A4189 Other specified sepsis: Secondary | ICD-10-CM | POA: Diagnosis not present

## 2015-04-10 DIAGNOSIS — Z87442 Personal history of urinary calculi: Secondary | ICD-10-CM

## 2015-04-10 DIAGNOSIS — N179 Acute kidney failure, unspecified: Secondary | ICD-10-CM | POA: Diagnosis present

## 2015-04-10 DIAGNOSIS — R4182 Altered mental status, unspecified: Secondary | ICD-10-CM | POA: Diagnosis present

## 2015-04-10 DIAGNOSIS — L8990 Pressure ulcer of unspecified site, unspecified stage: Secondary | ICD-10-CM | POA: Diagnosis present

## 2015-04-10 DIAGNOSIS — I5032 Chronic diastolic (congestive) heart failure: Secondary | ICD-10-CM | POA: Diagnosis present

## 2015-04-10 DIAGNOSIS — R1310 Dysphagia, unspecified: Secondary | ICD-10-CM | POA: Diagnosis present

## 2015-04-10 DIAGNOSIS — K219 Gastro-esophageal reflux disease without esophagitis: Secondary | ICD-10-CM | POA: Diagnosis present

## 2015-04-10 DIAGNOSIS — Z91010 Allergy to peanuts: Secondary | ICD-10-CM | POA: Diagnosis not present

## 2015-04-10 DIAGNOSIS — Z66 Do not resuscitate: Secondary | ICD-10-CM | POA: Diagnosis present

## 2015-04-10 DIAGNOSIS — R6520 Severe sepsis without septic shock: Secondary | ICD-10-CM | POA: Diagnosis present

## 2015-04-10 DIAGNOSIS — B961 Klebsiella pneumoniae [K. pneumoniae] as the cause of diseases classified elsewhere: Secondary | ICD-10-CM | POA: Diagnosis present

## 2015-04-10 DIAGNOSIS — R64 Cachexia: Secondary | ICD-10-CM | POA: Diagnosis present

## 2015-04-10 DIAGNOSIS — E119 Type 2 diabetes mellitus without complications: Secondary | ICD-10-CM | POA: Diagnosis present

## 2015-04-10 DIAGNOSIS — I129 Hypertensive chronic kidney disease with stage 1 through stage 4 chronic kidney disease, or unspecified chronic kidney disease: Secondary | ICD-10-CM | POA: Diagnosis present

## 2015-04-10 DIAGNOSIS — F419 Anxiety disorder, unspecified: Secondary | ICD-10-CM | POA: Diagnosis present

## 2015-04-10 DIAGNOSIS — R509 Fever, unspecified: Secondary | ICD-10-CM | POA: Diagnosis present

## 2015-04-10 DIAGNOSIS — Z951 Presence of aortocoronary bypass graft: Secondary | ICD-10-CM | POA: Diagnosis not present

## 2015-04-10 DIAGNOSIS — Z681 Body mass index (BMI) 19 or less, adult: Secondary | ICD-10-CM | POA: Diagnosis not present

## 2015-04-10 DIAGNOSIS — N390 Urinary tract infection, site not specified: Secondary | ICD-10-CM | POA: Diagnosis present

## 2015-04-10 DIAGNOSIS — I2510 Atherosclerotic heart disease of native coronary artery without angina pectoris: Secondary | ICD-10-CM | POA: Diagnosis present

## 2015-04-10 DIAGNOSIS — D649 Anemia, unspecified: Secondary | ICD-10-CM | POA: Diagnosis present

## 2015-04-10 DIAGNOSIS — R404 Transient alteration of awareness: Secondary | ICD-10-CM | POA: Diagnosis present

## 2015-04-10 DIAGNOSIS — R7881 Bacteremia: Secondary | ICD-10-CM | POA: Diagnosis present

## 2015-04-10 DIAGNOSIS — I472 Ventricular tachycardia: Secondary | ICD-10-CM | POA: Diagnosis not present

## 2015-04-10 DIAGNOSIS — A419 Sepsis, unspecified organism: Secondary | ICD-10-CM

## 2015-04-10 HISTORY — DX: Essential (primary) hypertension: I10

## 2015-04-10 LAB — COMPREHENSIVE METABOLIC PANEL
ALK PHOS: 186 U/L — AB (ref 38–126)
ALT: 45 U/L (ref 17–63)
AST: 52 U/L — ABNORMAL HIGH (ref 15–41)
Albumin: 2.9 g/dL — ABNORMAL LOW (ref 3.5–5.0)
Anion gap: 12 (ref 5–15)
BUN: 33 mg/dL — AB (ref 6–20)
CALCIUM: 9.7 mg/dL (ref 8.9–10.3)
CO2: 20 mmol/L — ABNORMAL LOW (ref 22–32)
CREATININE: 1.28 mg/dL — AB (ref 0.61–1.24)
Chloride: 106 mmol/L (ref 101–111)
GFR calc non Af Amer: 49 mL/min — ABNORMAL LOW (ref >60)
GFR, EST AFRICAN AMERICAN: 56 mL/min — AB (ref >60)
GLUCOSE: 228 mg/dL — AB (ref 65–99)
Potassium: 4.9 mmol/L (ref 3.5–5.1)
Sodium: 138 mmol/L (ref 135–145)
Total Bilirubin: 2.4 mg/dL — ABNORMAL HIGH (ref 0.3–1.2)
Total Protein: 6.9 g/dL (ref 6.5–8.1)

## 2015-04-10 LAB — CBC WITH DIFFERENTIAL/PLATELET
BASOS ABS: 0 10*3/uL (ref 0–0.1)
Basophils Relative: 0 %
Eosinophils Absolute: 0 10*3/uL (ref 0–0.7)
Eosinophils Relative: 0 %
HCT: 43.5 % (ref 40.0–52.0)
Hemoglobin: 14.4 g/dL (ref 13.0–18.0)
LYMPHS PCT: 4 %
Lymphs Abs: 0.3 10*3/uL — ABNORMAL LOW (ref 1.0–3.6)
MCH: 29.8 pg (ref 26.0–34.0)
MCHC: 33.1 g/dL (ref 32.0–36.0)
MCV: 90.1 fL (ref 80.0–100.0)
Monocytes Absolute: 0.1 10*3/uL — ABNORMAL LOW (ref 0.2–1.0)
Monocytes Relative: 1 %
NEUTROS ABS: 7.5 10*3/uL — AB (ref 1.4–6.5)
NEUTROS PCT: 95 %
PLATELETS: 195 10*3/uL (ref 150–440)
RBC: 4.83 MIL/uL (ref 4.40–5.90)
RDW: 16.4 % — AB (ref 11.5–14.5)
WBC: 7.8 10*3/uL (ref 3.8–10.6)

## 2015-04-10 LAB — URINALYSIS COMPLETE WITH MICROSCOPIC (ARMC ONLY)
BACTERIA UA: NONE SEEN
BILIRUBIN URINE: NEGATIVE
GLUCOSE, UA: 50 mg/dL — AB
Ketones, ur: NEGATIVE mg/dL
LEUKOCYTES UA: NEGATIVE
NITRITE: NEGATIVE
Protein, ur: 30 mg/dL — AB
Specific Gravity, Urine: 1.027 (ref 1.005–1.030)
WBC, UA: NONE SEEN WBC/hpf (ref 0–5)
pH: 5 (ref 5.0–8.0)

## 2015-04-10 LAB — LACTIC ACID, PLASMA: LACTIC ACID, VENOUS: 3.3 mmol/L — AB (ref 0.5–2.0)

## 2015-04-10 LAB — GLUCOSE, CAPILLARY: Glucose-Capillary: 249 mg/dL — ABNORMAL HIGH (ref 65–99)

## 2015-04-10 MED ORDER — ACETAMINOPHEN 650 MG RE SUPP
650.0000 mg | Freq: Once | RECTAL | Status: AC
  Administered 2015-04-10: 650 mg via RECTAL

## 2015-04-10 MED ORDER — ONDANSETRON HCL 4 MG/2ML IJ SOLN: 4.0000 mg | Freq: Four times a day (QID) | INTRAMUSCULAR | Status: DC | PRN

## 2015-04-10 MED ORDER — ENOXAPARIN SODIUM 100 MG/ML ~~LOC~~ SOLN
SUBCUTANEOUS | Status: AC
  Filled 2015-04-10: qty 1

## 2015-04-10 MED ORDER — ASPIRIN 81 MG PO CHEW
CHEWABLE_TABLET | ORAL | Status: AC
  Filled 2015-04-10: qty 1

## 2015-04-10 MED ORDER — ACETAMINOPHEN 325 MG PO TABS: 650.0000 mg | ORAL_TABLET | Freq: Four times a day (QID) | ORAL | Status: DC | PRN

## 2015-04-10 MED ORDER — SODIUM CHLORIDE 0.9 % IJ SOLN
3.0000 mL | Freq: Two times a day (BID) | INTRAMUSCULAR | Status: DC
  Administered 2015-04-10 – 2015-04-15 (×10): 3 mL via INTRAVENOUS

## 2015-04-10 MED ORDER — LEVOFLOXACIN IN D5W 750 MG/150ML IV SOLN
INTRAVENOUS | Status: AC
  Administered 2015-04-10: 750 mg via INTRAVENOUS
  Filled 2015-04-10: qty 150

## 2015-04-10 MED ORDER — ACETAMINOPHEN 650 MG RE SUPP
RECTAL | Status: AC
  Filled 2015-04-10: qty 1

## 2015-04-10 MED ORDER — ALBUTEROL SULFATE (2.5 MG/3ML) 0.083% IN NEBU: 2.5000 mg | INHALATION_SOLUTION | RESPIRATORY_TRACT | Status: DC | PRN

## 2015-04-10 MED ORDER — SODIUM CHLORIDE 0.9 % IV BOLUS (SEPSIS)
1000.0000 mL | Freq: Once | INTRAVENOUS | Status: AC
  Administered 2015-04-10: 1000 mL via INTRAVENOUS

## 2015-04-10 MED ORDER — DOCUSATE SODIUM 100 MG PO CAPS
100.0000 mg | ORAL_CAPSULE | Freq: Two times a day (BID) | ORAL | Status: DC
Start: 2015-04-10 — End: 2015-04-10

## 2015-04-10 MED ORDER — ASPIRIN EC 81 MG PO TBEC
81.0000 mg | DELAYED_RELEASE_TABLET | Freq: Every day | ORAL | Status: DC
  Administered 2015-04-10 – 2015-04-15 (×6): 81 mg via ORAL
  Filled 2015-04-10 (×5): qty 1

## 2015-04-10 MED ORDER — INSULIN ASPART 100 UNIT/ML ~~LOC~~ SOLN: 0.0000 [IU] | Freq: Every day | SUBCUTANEOUS | Status: DC

## 2015-04-10 MED ORDER — INSULIN ASPART 100 UNIT/ML ~~LOC~~ SOLN
0.0000 [IU] | Freq: Three times a day (TID) | SUBCUTANEOUS | Status: DC
  Administered 2015-04-11: 2 [IU] via SUBCUTANEOUS
  Administered 2015-04-12: 3 [IU] via SUBCUTANEOUS
  Administered 2015-04-12 – 2015-04-13 (×2): 2 [IU] via SUBCUTANEOUS
  Administered 2015-04-14 (×2): 3 [IU] via SUBCUTANEOUS
  Administered 2015-04-15: 2 [IU] via SUBCUTANEOUS
  Filled 2015-04-10: qty 2
  Filled 2015-04-10: qty 3
  Filled 2015-04-10 (×3): qty 2
  Filled 2015-04-10: qty 3
  Filled 2015-04-10: qty 1
  Filled 2015-04-10: qty 3
  Filled 2015-04-10: qty 2

## 2015-04-10 MED ORDER — ALLOPURINOL 100 MG PO TABS
100.0000 mg | ORAL_TABLET | Freq: Every day | ORAL | Status: DC
  Administered 2015-04-12 – 2015-04-15 (×3): 100 mg via ORAL
  Filled 2015-04-10 (×5): qty 1

## 2015-04-10 MED ORDER — ONDANSETRON HCL 4 MG PO TABS: 4.0000 mg | ORAL_TABLET | Freq: Four times a day (QID) | ORAL | Status: DC | PRN

## 2015-04-10 MED ORDER — CARVEDILOL 12.5 MG PO TABS
12.5000 mg | ORAL_TABLET | Freq: Two times a day (BID) | ORAL | Status: DC
  Administered 2015-04-11 – 2015-04-15 (×9): 12.5 mg via ORAL
  Filled 2015-04-10 (×9): qty 1

## 2015-04-10 MED ORDER — LEVOFLOXACIN IN D5W 750 MG/150ML IV SOLN
750.0000 mg | Freq: Once | INTRAVENOUS | Status: AC
  Administered 2015-04-10: 750 mg via INTRAVENOUS

## 2015-04-10 MED ORDER — SODIUM CHLORIDE 0.9 % IV SOLN
INTRAVENOUS | Status: AC
  Administered 2015-04-10 (×2): via INTRAVENOUS

## 2015-04-10 MED ORDER — VANCOMYCIN HCL IN DEXTROSE 1-5 GM/200ML-% IV SOLN
1000.0000 mg | Freq: Once | INTRAVENOUS | Status: AC
  Administered 2015-04-10: 1000 mg via INTRAVENOUS

## 2015-04-10 MED ORDER — CEFTRIAXONE SODIUM IN DEXTROSE 40 MG/ML IV SOLN
2.0000 g | INTRAVENOUS | Status: DC
  Administered 2015-04-11 – 2015-04-12 (×2): 2 g via INTRAVENOUS
  Filled 2015-04-10 (×3): qty 50

## 2015-04-10 MED ORDER — ACETAMINOPHEN 650 MG RE SUPP: 650.0000 mg | Freq: Four times a day (QID) | RECTAL | Status: DC | PRN

## 2015-04-10 MED ORDER — SODIUM CHLORIDE 0.9 % IV BOLUS (SEPSIS)
540.0000 mL | Freq: Once | INTRAVENOUS | Status: AC
  Administered 2015-04-10: 540 mL via INTRAVENOUS

## 2015-04-10 MED ORDER — VANCOMYCIN HCL IN DEXTROSE 1-5 GM/200ML-% IV SOLN
INTRAVENOUS | Status: AC
  Administered 2015-04-10: 1000 mg via INTRAVENOUS
  Filled 2015-04-10: qty 200

## 2015-04-10 MED ORDER — PANTOPRAZOLE SODIUM 40 MG PO TBEC
40.0000 mg | DELAYED_RELEASE_TABLET | Freq: Every day | ORAL | Status: DC
  Administered 2015-04-11 – 2015-04-15 (×5): 40 mg via ORAL
  Filled 2015-04-10 (×6): qty 1

## 2015-04-10 MED ORDER — AZTREONAM 1 G IJ SOLR
1.0000 g | Freq: Once | INTRAMUSCULAR | Status: AC
  Administered 2015-04-10: 1 g via INTRAVENOUS
  Filled 2015-04-10: qty 1

## 2015-04-10 MED ORDER — CEFTRIAXONE SODIUM IN DEXTROSE 40 MG/ML IV SOLN
2.0000 g | Freq: Once | INTRAVENOUS | Status: AC
  Administered 2015-04-10: 2 g via INTRAVENOUS
  Filled 2015-04-10: qty 50

## 2015-04-10 MED ORDER — ENOXAPARIN SODIUM 40 MG/0.4ML ~~LOC~~ SOLN
40.0000 mg | SUBCUTANEOUS | Status: DC
  Administered 2015-04-10 – 2015-04-12 (×3): 40 mg via SUBCUTANEOUS
  Filled 2015-04-10 (×2): qty 0.4

## 2015-04-10 MED ORDER — DOCUSATE SODIUM 100 MG PO CAPS
100.0000 mg | ORAL_CAPSULE | Freq: Two times a day (BID) | ORAL | Status: DC
  Administered 2015-04-11 – 2015-04-15 (×5): 100 mg via ORAL
  Filled 2015-04-10 (×9): qty 1

## 2015-04-10 NOTE — H&P (Signed)
[facility] [place] NAME: [name]    MR#:  [id number]  DATE OF BIRTH:  April 30, 1926  DATE OF ADMISSION:  04/10/2015  PRIMARY CARE PHYSICIAN: Idelle Crouch, MD   REQUESTING/REFERRING PHYSICIAN: Dr. Archie Balboa  CHIEF COMPLAINT:   Chief Complaint  Patient presents with  . Fever  . Altered Mental Status    HISTORY OF PRESENT ILLNESS:  [name]  is a 81 y.o. male with a known history of diabetes mellitus, congestive heart failure, prostate cancer presents to the emergency room sent in by his hospice nurse after patient was noticed to have altered mental status and fever.. In the emergency room patient has been found to have fever 103, tachycardic and is being admitted to the hospitalist service for severe sepsis with elevated lactic acid. Source of infection is unclear but possibly UTI. Patient mentions generalized weakness some frequency with urination. No shortness of breath, chest pain abdominal pain or diarrhea. No recent antibiotics use.  PAST MEDICAL HISTORY:   Past Medical History  Diagnosis Date  . CAD (coronary artery disease)   . Diabetes mellitus, type 2   . GERD (gastroesophageal reflux disease)   . Hyperlipidemia   . Anxiety   . Anemia     iron deficient a. ? h/o GI bleed  . Nephrolithiasis   . Prostate cancer   . Nephrolithiasis   . CHF (congestive heart failure)   . Hypertension     PAST SURGICAL HISTORY:   Past Surgical History  Procedure Laterality Date  . Cardiac bypass surgery  2002    five vessel  . Radiation seed implants for prostate cancer    . H/o esoph dilatation      x 2 in past  . Left thr  2004  . Hernia repair  1994  . Total hip arthroplasty  2004    SOCIAL HISTORY:   History  Substance Use Topics  . Smoking status: Never Smoker   . Smokeless tobacco: Not on file  . Alcohol Use: No    FAMILY HISTORY:   Family History  Problem Relation Age of Onset  . Heart disease  Brother     DRUG ALLERGIES:   Allergies  Allergen Reactions  . Iodinated Diagnostic Agents Other (See Comments)    Reaction:  Unknown   . Penicillins Other (See Comments)    Reaction:  Unknown    REVIEW OF SYSTEMS:   Review of Systems  Constitutional: Positive for fever and malaise/fatigue. Negative for chills and weight loss.  HENT: Negative for hearing loss and nosebleeds.   Eyes: Negative for blurred vision, double vision and pain.  Respiratory: Negative for cough, hemoptysis, sputum production, shortness of breath and wheezing.   Cardiovascular: Negative for chest pain, palpitations, orthopnea and leg swelling.  Gastrointestinal: Negative for nausea, vomiting, abdominal pain, diarrhea and constipation.  Genitourinary: Positive for frequency. Negative for dysuria and hematuria.  Musculoskeletal: Positive for myalgias. Negative for back pain and falls.  Skin: Negative for rash.  Neurological: Positive for weakness. Negative for dizziness, tremors, sensory change, speech change, focal weakness, seizures and headaches.  Endo/Heme/Allergies: Does not bruise/bleed easily.  Psychiatric/Behavioral: Negative for depression and memory loss. The patient is not nervous/anxious.     MEDICATIONS AT HOME:   Prior to Admission medications   Medication Sig Start Date End Date Taking? Authorizing Provider  allopurinol (ZYLOPRIM) 100 MG tablet Take 100 mg by mouth daily.   Yes Historical Provider, MD  amLODipine (NORVASC)  5 MG tablet Take 5 mg by mouth daily.    Yes Historical Provider, MD  carvedilol (COREG) 12.5 MG tablet Take 12.5 mg by mouth 2 (two) times daily.   Yes Historical Provider, MD  omeprazole (PRILOSEC) 20 MG capsule Take 20 mg by mouth daily.   Yes Historical Provider, MD      VITAL SIGNS:  Blood pressure 148/68, pulse 116, temperature 103.3 F (39.6 C), temperature source Rectal, resp. rate 28, height 5\' 11"  (1.803 m), weight 54.432 kg (120 lb), SpO2 96 %.  PHYSICAL  EXAMINATION:  Physical Exam  GENERAL:  81 y.o.-year-old patient lying in the bed with no acute distress. Looks critically ill. EYES: Pupils equal, round, reactive to light and accommodation. No scleral icterus. Extraocular muscles intact.  HEENT: Head atraumatic, normocephalic. Oropharynx and nasopharynx clear. No oropharyngeal erythema, moist oral mucosa  NECK:  Supple, no jugular venous distention. No thyroid enlargement, no tenderness.  LUNGS: Normal breath sounds bilaterally, no wheezing, rales, rhonchi. No use of accessory muscles of respiration.  CARDIOVASCULAR: S1, S2 normal. No murmurs, rubs, or gallops.  ABDOMEN: Soft, nontender, nondistended. Bowel sounds present. No organomegaly or mass. Mild suprapubic tenderness EXTREMITIES: No pedal edema, cyanosis, or clubbing. + 2 pedal & radial pulses b/l.   NEUROLOGIC: Cranial nerves II through XII are intact. No focal Motor or sensory deficits appreciated b/l PSYCHIATRIC: The patient is drowsy. Oriented to person, place and time.Marland Kitchen SKIN: No obvious rash, lesion, or ulcer.   LABORATORY PANEL:   CBC  Recent Labs Lab 04/10/15 1342  WBC 7.8  HGB 14.4  HCT 43.5  PLT 195   ------------------------------------------------------------------------------------------------------------------  Chemistries   Recent Labs Lab 04/10/15 1342  NA 138  K 4.9  CL 106  CO2 20*  GLUCOSE 228*  BUN 33*  CREATININE 1.28*  CALCIUM 9.7  AST 52*  ALT 45  ALKPHOS 186*  BILITOT 2.4*   ------------------------------------------------------------------------------------------------------------------  Cardiac Enzymes No results for input(s): TROPONINI in the last 168 hours. ------------------------------------------------------------------------------------------------------------------  RADIOLOGY:  Dg Chest Port 1 View  04/10/2015   CLINICAL DATA:  Fever, altered mental status  EXAM: PORTABLE CHEST - 1 VIEW  COMPARISON:  11/13/2014  FINDINGS:  There is no focal parenchymal opacity. There is no pleural effusion or pneumothorax. The heart and mediastinal contours are unremarkable. 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  IMPRESSION: No active disease.   Electronically Signed   By: Kathreen Devoid   On: 04/10/2015 14:14     IMPRESSION AND PLAN:   * Severe sepsis Etiology unclear at this point. Likely sources UTI. Urine has been sent from the emergency room but labs are pending. We'll start patient on ceftriaxone and wait for results. Chest x-ray shows no pneumonia. No other source of infection found bolus IV fluids. Lactic acid elevated at 3.3 and will be repeated in 3 hours. Blood cultures sent. And is critically ill. He is DO NOT RESUSCITATE..  * Chronic diastolic congestive heart failure Signs of fluid overload and patient is being started on IV fluids. Monitor For fluid overload.  * DM SSI, ADA  * CKD3 Stable  * DVT prophylaxis Lovenox  All the records are reviewed and case discussed with ED provider. Management plans discussed with the patient, family and they are in agreement.  CODE STATUS: DNR/DNI  TOTAL CRITICAL CARE TIME TAKING CARE OF THIS PATIENT: 45 minutes.    Hillary Bow R M.D on 04/10/2015 at 4:46 PM  Between 7am to  6pm - Pager - [phone]  After 6pm go to [email or web address] - password [email or web address]  Office  [phone]  CC: Primary care physician; Idelle Crouch, MD

## 2015-04-10 NOTE — ED Notes (Signed)
Patient resting in stretcher. Respirations even and unlabored. No obvious distress. Cardiac monitor in place. No needs/concerns verbalized at this time. Call bell within reach. Encouraged to call with needs. Will continue to monitor. 

## 2015-04-10 NOTE — ED Notes (Addendum)
Admitting hospitalist aware of critical lab value, Dr Darvin Neighbours

## 2015-04-10 NOTE — ED Notes (Signed)
PT from home via ems- hospice nurse called ems d/t ams and fever. Pt currently hospice care d/t CA. Pt has had decline in mental status since Friday.

## 2015-04-10 NOTE — ED Provider Notes (Signed)
[facility] Emergency Department Provider Note   ____________________________________________  Time seen: On EMS arrival  I have reviewed the triage vital signs and the nursing notes.   HISTORY  Chief Complaint Fever and Altered Mental Status   History limited by: Altered mental status   HPI [name] is a 81 y.o. male who presents to the emergency department today via EMS because of concerns for altered mental status and fever. Patient was coming from home. Hospice nurse was evaluating patient today when she became concern for altered mental status. She also noted the patient had a fever. Apparently the patient is been having declining mental status for the past 3-4 days.patient is unfortunately unable to provide any history himself.     Past Medical History  Diagnosis Date  . CAD (coronary artery disease)   . Diabetes mellitus, type 2   . GERD (gastroesophageal reflux disease)   . Hyperlipidemia   . Anxiety   . Anemia     iron deficient a. ? h/o GI bleed  . Nephrolithiasis   . Prostate cancer   . Nephrolithiasis   . CHF (congestive heart failure)     Patient Active Problem List   Diagnosis Date Noted  . UTI (urinary tract infection) 07/05/2014  . Aortic valve stenosis 12/10/2011  . DIABETES MELLITUS 11/05/2010  . HYPERLIPIDEMIA 11/05/2010  . HYPERTENSION 11/05/2010  . MURMUR 11/05/2010  . CHEST PAIN UNSPECIFIED 11/05/2010  . CAD, UNSPECIFIED SITE 04/04/2009    Past Surgical History  Procedure Laterality Date  . Cardiac bypass surgery  2002    five vessel  . Radiation seed implants for prostate cancer    . H/o esoph dilatation      x 2 in past  . Left thr  2004  . Hernia repair  1994  . Total hip arthroplasty  2004    Current Outpatient Rx  Name  Route  Sig  Dispense  Refill  . ALPRAZolam (XANAX) 0.25 MG tablet   Oral   Take 0.25 mg by mouth every 8 (eight) hours as needed.           Marland Kitchen amLODipine (NORVASC) 5 MG  tablet   Oral   Take 5 mg by mouth daily.          Marland Kitchen aspirin 81 MG tablet   Oral   Take 81 mg by mouth daily.         . carvedilol (COREG) 25 MG tablet   Oral   Take 25 mg by mouth 2 (two) times daily with a meal.         . furosemide (LASIX) 40 MG tablet   Oral   Take 40 mg by mouth daily as needed.          . Loperamide HCl (IMODIUM A-D) 2 MG CHEW   Oral   Chew by mouth as needed.          Marland Kitchen losartan (COZAAR) 100 MG tablet   Oral   Take 100 mg by mouth daily.         . megestrol (MEGACE) 400 MG/10ML suspension   Oral   Take 400 mg by mouth daily. As needed for poor appetite         . nitroGLYCERIN (NITROSTAT) 0.4 MG SL tablet   Sublingual   Place 0.4 mg under the tongue every 5 (five) minutes as needed.           Marland Kitchen omeprazole (PRILOSEC) 40 MG capsule  Oral   Take 40 mg by mouth daily.           . ondansetron (ZOFRAN) 4 MG tablet   Oral   Take 4 mg by mouth every 8 (eight) hours as needed.         . saccharomyces boulardii (FLORASTOR) 250 MG capsule   Oral   Take 250 mg by mouth daily.           . vitamin B-12 (CYANOCOBALAMIN) 1000 MCG tablet   Oral   Take 2,000 mcg by mouth daily.           Allergies Iodinated diagnostic agents and Penicillins  Family History  Problem Relation Age of Onset  . Heart disease Brother     Social History History  Substance Use Topics  . Smoking status: Never Smoker   . Smokeless tobacco: Not on file  . Alcohol Use: No    Review of Systems  Unable to obtain secondary to altered mental status  ____________________________________________   PHYSICAL EXAM:  VITAL SIGNS: ED Triage Vitals  Enc Vitals Group     BP 04/10/15 1335 154/82 mmHg     Pulse Rate 04/10/15 1335 110     Resp 04/10/15 1335 24     Temp 04/10/15 1335 103.3 F (39.6 C)     Temp Source 04/10/15 1335 Rectal     SpO2 04/10/15 1335 97 %     Weight 04/10/15 1335 120 lb (54.432 kg)     Height 04/10/15 1335 5\' 11"  (1.803  m)   Constitutional:  Awake, alert, somewhat chronically ill-appearing, cachectic Eyes: Conjunctivae are normal. PERRL. Normal extraocular movements. ENT   Head: Normocephalic and atraumatic.   Nose: No congestion/rhinnorhea.   Mouth/Throat: Mucous membranes are moist.   Neck: No stridor. Hematological/Lymphatic/Immunilogical: No cervical lymphadenopathy. Cardiovascular: tachycardic, irregularly irregular.  No murmurs, rubs, or gallops. Respiratory: Normal respiratory effort without tachypnea nor retractions. Breath sounds are clear and equal bilaterally. No wheezes/rales/rhonchi. Gastrointestinal: Soft and nontender. No distention.  Genitourinary: Deferred Musculoskeletal: Normal range of motion in all extremities. No joint effusions.  No lower extremity tenderness nor edema. Neurologic:  Awake, alert, oriented only to name. Appears to be moving all extremities. Skin:  Skin is warm, dry and intact. No rash noted.   ____________________________________________    LABS (pertinent positives/negatives)  Labs Reviewed  CBC WITH DIFFERENTIAL/PLATELET - Abnormal; Notable for the following:    RDW 16.4 (*)    Neutro Abs 7.5 (*)    Lymphs Abs 0.3 (*)    Monocytes Absolute 0.1 (*)    All other components within normal limits  COMPREHENSIVE METABOLIC PANEL - Abnormal; Notable for the following:    CO2 20 (*)    Glucose, Bld 228 (*)    BUN 33 (*)    Creatinine, Ser 1.28 (*)    Albumin 2.9 (*)    AST 52 (*)    Alkaline Phosphatase 186 (*)    Total Bilirubin 2.4 (*)    GFR calc non Af Amer 49 (*)    GFR calc Af Amer 56 (*)    All other components within normal limits  LACTIC ACID, PLASMA - Abnormal; Notable for the following:    Lactic Acid, Venous 3.3 (*)    All other components within normal limits  CULTURE, BLOOD (ROUTINE X 2)  CULTURE, BLOOD (ROUTINE X 2)  URINE CULTURE  URINALYSIS COMPLETEWITH MICROSCOPIC ([facility] ONLY)  URINALYSIS COMPLETEWITH MICROSCOPIC ([facility]  ONLY)     ____________________________________________   EKG  Apolonio Schneiders, attending physician, personally viewed and interpreted this EKG  EKG Time: 1325 Rate: 111 Rhythm: atrial fib with rapid ventricular response Axis: left axis deviation Intervals: qtc 440 QRS: left bundle branch block ST changes: no ST elevation equivalent    ____________________________________________    RADIOLOGY  Chest x-ray IMPRESSION: No active disease. ____________________________________________   PROCEDURES  Procedure(s) performed: None  Critical Care performed: No  CRITICAL CARE Performed by: Nance Pear   Total critical care time: 30  Critical care time was exclusive of separately billable procedures and treating other patients.  Critical care was necessary to treat or prevent imminent or life-threatening deterioration.  Critical care was time spent personally by me on the following activities: development of treatment plan with patient and/or surrogate as well as nursing, discussions with consultants, evaluation of patient's response to treatment, examination of patient, obtaining history from patient or surrogate, ordering and performing treatments and interventions, ordering and review of laboratory studies, ordering and review of radiographic studies, pulse oximetry and re-evaluation of patient's condition.  ____________________________________________   INITIAL IMPRESSION / ASSESSMENT AND PLAN / ED COURSE  Pertinent labs & imaging results that were available during my care of the patient were reviewed by me and considered in my medical decision making (see chart for details).  Patient presents to the emergency department via EMS today because of concerns for altered mental status and fever. Upon arrival patient was noted to be oriented only to his name. Patient was noted to be tachycardic and febrile. Given this and the altered mental status code sepsis was  called.  Lactic acid was noted to be elevated to 3.3. Chest x-ray did not show pneumonia. At time of admission still awaiting urine. Patient was written for broad-spectrum antibiotics  ____________________________________________   FINAL CLINICAL IMPRESSION(S) / ED DIAGNOSES  Final diagnoses:  Sepsis, due to unspecified organism     Nance Pear, MD 04/10/15 [phone]

## 2015-04-11 LAB — GLUCOSE, CAPILLARY
Glucose-Capillary: 106 mg/dL — ABNORMAL HIGH (ref 65–99)
Glucose-Capillary: 145 mg/dL — ABNORMAL HIGH (ref 65–99)
Glucose-Capillary: 108 mg/dL — ABNORMAL HIGH (ref 65–99)
Glucose-Capillary: 113 mg/dL — ABNORMAL HIGH (ref 65–99)

## 2015-04-11 NOTE — Progress Notes (Signed)
[name] is a 81 y.o. male  <principal problem not specified>   SUBJECTIVE:  Pt admitted with sepsis with gram negative rods in blood and urine. Currently stable, in NAD. Denies CP or SOB. On IV Rocephin and Azactam. No N/V. Afebrile. Stable on RA. Tachycardia has resolved.  ______________________________________________________________________  ROS: Review of systems is unremarkable for any active cardiac,respiratory, GI, GU, hematologic, neurologic or psychiatric systems, 10 systems reviewed.  @CMEDLIST @  Past Medical History  Diagnosis Date  . CAD (coronary artery disease)   . Diabetes mellitus, type 2   . GERD (gastroesophageal reflux disease)   . Hyperlipidemia   . Anxiety   . Anemia     iron deficient a. ? h/o GI bleed  . Nephrolithiasis   . Prostate cancer   . Nephrolithiasis   . CHF (congestive heart failure)   . Hypertension     Past Surgical History  Procedure Laterality Date  . Cardiac bypass surgery  2002    five vessel  . Radiation seed implants for prostate cancer    . H/o esoph dilatation      x 2 in past  . Left thr  2004  . Hernia repair  1994  . Total hip arthroplasty  2004    PHYSICAL EXAM:  BP 117/53 mmHg  Pulse 69  Temp(Src) 98 F (36.7 C) (Oral)  Resp 19  Ht 5\' 11"  (1.803 m)  Wt 60.102 kg (132 lb 8 oz)  BMI 18.49 kg/m2  SpO2 100%  Wt Readings from Last 3 Encounters:  04/10/15 60.102 kg (132 lb 8 oz)  07/05/14 65.318 kg (144 lb)  11/06/13 65.545 kg (144 lb 8 oz)            Constitutional: NAD Neck: supple, no thyromegaly Respiratory: CTA, no rales or wheezes Cardiovascular: RRR, no murmur, no gallop Abdomen: soft, good BS, nontender Extremities: no edema Neuro: alert and oriented, no focal motor or sensory deficits  ASSESSMENT/PLAN:  Labs and imaging studies were reviewed  Will continue IV ABX and f/u on cultures. PT and CM consults today. Pt is DNR/DNI. Repeat labs in AM.

## 2015-04-11 NOTE — Progress Notes (Signed)
Initial Nutrition Assessment  INTERVENTION:  Meals and Snacks: Cater to patient preferences Medical Food Supplement Therapy: will recommend Mighty Shakes on meal trays TID for added nutrition (each shake provides 300kcals and 9g protein)    NUTRITION DIAGNOSIS:  Predicted suboptimal nutrient intake related to acute illness as evidenced by estimated needs, per patient/family report.  GOAL:  Patient will meet greater than or equal to 90% of their needs  MONITOR:   (Energy Intake, Electrolyte and Renal Profile, Glucose Profile, Anthropometrics)  REASON FOR ASSESSMENT:   (RD Screen, Low BMI)    ASSESSMENT:  Pt admitted with AMS, fever and weakness, noted to be septic.  PMHx:  Past Medical History  Diagnosis Date  . CAD (coronary artery disease)   . Diabetes mellitus, type 2   . GERD (gastroesophageal reflux disease)   . Hyperlipidemia   . Anxiety   . Anemia     iron deficient a. ? h/o GI bleed  . Nephrolithiasis   . Prostate cancer   . Nephrolithiasis   . CHF (congestive heart failure)   . Hypertension     Diet Order:  Diet Carb Modified Fluid consistency:: Thin; Room service appropriate?: Yes  Current Nutrition: Pt could not remember if he ate lunch or not on visit. Lovena Le, RN reports pt eating well with Hospice representative at lunch today. Recorded 90-100% of meals today.  Food/Nutrition-Related History: Pt could not remember how his usual intake PTA was doing. Per MST pt with decreased appetite.    Medications: Novolog, Protonix  Electrolyte/Renal Profile and Glucose Profile:   Recent Labs Lab 04/10/15 1342  NA 138  K 4.9  CL 106  CO2 20*  BUN 33*  CREATININE 1.28*  CALCIUM 9.7  GLUCOSE 228*   Protein Profile:  Recent Labs Lab 04/10/15 1342  ALBUMIN 2.9*    Gastrointestinal Profile: Last BM: 6/30    Nutrition-Focused Physical Exam Findings: Nutrition-Focused physical exam completed. Findings are WDL fat depletion, mild muscle depletion  of calf and thigh regions as well as shoulder region, and no edema.     Weight Change: Per [facility], pt with decrease in weight since fall of last year (5% weight loss in 10 months) Anthropometrics:   Height: Pt reports height of 5'8-5'9, recorded height 5'11  Ht Readings from Last 1 Encounters:  04/10/15 5\' 11"  (1.803 m)    Weight:  Wt Readings from Last 1 Encounters:  04/10/15 132 lb 8 oz (60.102 kg)    Ideal Body Weight:   78kg  Wt Readings from Last 10 Encounters:  04/10/15 132 lb 8 oz (60.102 kg)  07/05/14 144 lb (65.318 kg)  11/06/13 144 lb 8 oz (65.545 kg)  05/15/13 146 lb 12 oz (66.565 kg)  10/17/12 134 lb 8 oz (61.009 kg)  04/15/12 138 lb (62.596 kg)  12/10/11 139 lb 8 oz (63.277 kg)  11/26/10 148 lb 8 oz (67.359 kg)  11/06/10 145 lb (65.772 kg)  11/05/10 145 lb 12 oz (66.112 kg)    BMI:  Body mass index is 18.49 kg/(m^2).  Estimated Nutritional Needs:  Kcal:  1943-2297kcals, BEE: 1472kcals, TEE: (IF 1.1-1.3)(AF 1.2) using IBW of 78kg  Protein:  78-94g protein (1.0-1.2g/kg) using IBW of 78kg  Fluid:  1950-2367mL of fluid (25-20mL/kg) using IBW of 78kg  Skin:  Reviewed, no issues  EDUCATION NEEDS:  Education needs no appropriate at this time    [place], RD, LDN Pager [phone]

## 2015-04-11 NOTE — Evaluation (Signed)
Physical Therapy Evaluation Patient Details Name: [name] MRN: [id number] DOB: Mar 14, 1926 Today's Date: 04/11/2015   History of Present Illness  Pt is a 81 year old male admitted to the hospital with fever and altered mental status.   Clinical Impression  Pt presents with history of DM, CHF, prostate cancer, nephrolithiasis, CAD, GERD, anxiety, anemia, and HTN. Examination revealed that pt performs bed mobility at min assist for scooting/body positioning and min assist for transfer using RW and slight lifting support. Pt ambulates 12 ft with RW and CGA for safety within the hospital. He takes a long time to ambulate 12 ft, but it is believed that this was close to his premorbid gait speed per conversation with his nurse from home. He appears to be competent in the use of his RW and sequencing. His primary physical deficits include decreased endurance, global weakness, and slightly impaired cognition. Pt will benefit from skilled PT in order to address these deficits and safely return home. Pt good support at home in the form of hospice during the day and lives with his wife.     Follow Up Recommendations Home health PT    Equipment Recommendations   (None needed. Pt states he has everything at home )    Recommendations for Other Services       Precautions / Restrictions Precautions Precautions: Fall Restrictions Weight Bearing Restrictions: No      Mobility  Bed Mobility Overal bed mobility: Needs Assistance Bed Mobility: Sit to Supine       Sit to supine: Min assist   General bed mobility comments: Pt able to perform bed mobility with assist for scooting to get himself properly aligned in bed. It is believed the pt could successfully perform this given more time to complete the task.   Transfers Overall transfer level: Needs assistance Equipment used: Rolling walker (2 wheeled) Transfers: Sit to/from Stand Sit to Stand: Min assist         General transfer  comment: Pt can perform transfers with assist for lifting support. He appears to be competent in proper use of RW. Pt stated that when he would stand up his wife or somebody else would give him a slight lift up.   Ambulation/Gait Ambulation/Gait assistance: Modified independent (Device/Increase time);Min guard Ambulation Distance (Feet): 12 Feet Assistive device: Rolling walker (2 wheeled) Gait Pattern/deviations: Step-to pattern Gait velocity: Slow   General Gait Details: Pt able to ambulate with CGA for safety within the hospital and RW. Pt is able to perform the sequencing independently but he takes a great deal of time to ambulate short distances. Pt's nurse at home called within the session and confirmed to PT that his ambulation is usually very slow. Pt states that this was about how he walked prior to his hospital stay.   Stairs            Wheelchair Mobility    Modified Rankin (Stroke Patients Only)       Balance Overall balance assessment: No apparent balance deficits (not formally assessed)                                           Pertinent Vitals/Pain Pain Assessment: No/denies pain    Home Living Family/patient expects to be discharged to:: Private residence Living Arrangements: Spouse/significant other (hospice in home ) Available Help at Discharge: Family (hospice during the day) Type  of Home: House Home Access: Level entry     Home Layout: One level Home Equipment: Environmental consultant - 2 wheels (States he has all the necessary DME)      Prior Function                 Hand Dominance        Extremity/Trunk Assessment   Upper Extremity Assessment: Generalized weakness           Lower Extremity Assessment: Generalized weakness      Cervical / Trunk Assessment: Kyphotic  Communication   Communication: No difficulties  Cognition Arousal/Alertness: Awake/alert Behavior During Therapy: WFL for tasks assessed/performed Overall  Cognitive Status: Difficult to assess (Delayed response, occasionally forgot question)                      General Comments      Exercises Other Exercises Other Exercises: Pt performed bed exercises x10 bilateral LE at supervision for proper movement. Exercises included: quad sets, glute sets, SLR, hip abd, ankle pumps      Assessment/Plan    PT Assessment    PT Diagnosis     PT Problem List    PT Treatment Interventions     PT Goals (Current goals can be found in the Care Plan section) Acute Rehab PT Goals Patient Stated Goal: To go home PT Goal Formulation: With patient Time For Goal Achievement: 04/25/15 Potential to Achieve Goals: Good    Frequency Min 2X/week   Barriers to discharge        Co-evaluation               End of Session Equipment Utilized During Treatment: Gait belt Activity Tolerance: Patient tolerated treatment well Patient left: in bed;with call bell/phone within reach;with bed alarm set Nurse Communication: Mobility status         Time: 3567-0141 PT Time Calculation (min) (ACUTE ONLY): 35 min   Charges:   PT Evaluation $Initial PT Evaluation Tier I: 1 Procedure PT Treatments $Therapeutic Exercise: 8-22 mins   PT G CodesJanyth Dennis 04/20/15, 12:16 PM

## 2015-04-11 NOTE — Progress Notes (Signed)
Rcvd call from lab.  All BC bottles positive for gram negative rods.  MD informed.

## 2015-04-11 NOTE — Care Management (Signed)
Patient was sent to the ED 6/29 by his hospice nurse.  Patient says is being seen by hospice for about one year and does not know the name of the agency.  He says he or his wife is also seen by [facility].  When wife arrived on the unit, she was not able to give the name of the agency providing hospice service.  CM found that patient is open to [facility] since March 2016.  He is followed for CHF.  Physical therapy has evaluated and recommending home health physical therapy and agency says can provide this service.

## 2015-04-11 NOTE — Evaluation (Signed)
Physical Therapy Evaluation Patient Details Name: [name] MRN: [id number] DOB: December 01, 1925 Today's Date: 04/11/2015   History of Present Illness  Pt is a 81 year old male admitted to the hospital with fever and altered mental status.   Clinical Impression  Pt presents with history of DM, CHF, prostate cancer, nephrolithiasis, CAD, GERD, anxiety, anemia, and HTN. Examination revealed that pt performs bed mobility at min assist for scooting/body positioning and min assist for transfer using RW and slight lifting support. Pt ambulates 12 ft with RW and CGA for safety within the hospital. He takes a long time to ambulate 12 ft, but it is believed that this was close to his premorbid gait speed per conversation with his nurse from home. He appears to be competent in the use of his RW and sequencing. His primary physical deficits include decreased endurance, global weakness, and slightly impaired cognition. Pt will benefit from skilled PT in order to address these deficits and safely return home. Pt good support at home in the form of hospice during the day and lives with his wife.     Follow Up Recommendations Home health PT    Equipment Recommendations   (None needed. Pt states he has everything at home )    Recommendations for Other Services       Precautions / Restrictions Precautions Precautions: Fall Restrictions Weight Bearing Restrictions: No      Mobility  Bed Mobility Overal bed mobility: Needs Assistance Bed Mobility: Sit to Supine       Sit to supine: Min assist   General bed mobility comments: Pt able to perform bed mobility with assist for scooting to get himself properly aligned in bed. It is believed the pt could successfully perform this given more time to complete the task.   Transfers Overall transfer level: Needs assistance Equipment used: Rolling walker (2 wheeled) Transfers: Sit to/from Stand Sit to Stand: Min assist         General transfer comment:  Pt can perform transfers with assist for lifting support. He appears to be competent in proper use of RW. Pt stated that when he would stand up his wife or somebody else would give him a slight lift up.   Ambulation/Gait Ambulation/Gait assistance: Modified independent (Device/Increase time);Min guard Ambulation Distance (Feet): 12 Feet Assistive device: Rolling walker (2 wheeled) Gait Pattern/deviations: Step-to pattern Gait velocity: Slow   General Gait Details: Pt able to ambulate with CGA for safety within the hospital and RW. Pt is able to perform the sequencing independently but he takes a great deal of time to ambulate short distances. Pt's nurse at home called within the session and confirmed to PT that his ambulation is usually very slow. Pt states that this was about how he walked prior to his hospital stay.   Stairs            Wheelchair Mobility    Modified Rankin (Stroke Patients Only)       Balance Overall balance assessment: No apparent balance deficits (not formally assessed)                                           Pertinent Vitals/Pain Pain Assessment: No/denies pain    Home Living                        Prior Function  Hand Dominance        Extremity/Trunk Assessment                         Communication      Cognition Arousal/Alertness: Awake/alert Behavior During Therapy: WFL for tasks assessed/performed Overall Cognitive Status: Difficult to assess (Delayed response, occasionally forgot question)                      General Comments      Exercises Other Exercises Other Exercises: Pt performed bed exercises x10 bilateral LE at supervision for proper movement. Exercises included: quad sets, glute sets, SLR, hip abd, ankle pumps      Assessment/Plan    PT Assessment    PT Diagnosis     PT Problem List    PT Treatment Interventions     PT Goals (Current goals can  be found in the Care Plan section) Acute Rehab PT Goals Patient Stated Goal: To go home    Frequency Min 2X/week   Barriers to discharge        Co-evaluation               End of Session Equipment Utilized During Treatment: Gait belt Activity Tolerance: Patient tolerated treatment well Patient left: in bed;with call bell/phone within reach;with bed alarm set Nurse Communication: Mobility status         Time: 6962-9528 PT Time Calculation (min) (ACUTE ONLY): 35 min   Charges:         PT G CodesJanyth Contes 05/07/2015, 11:40 AM  Janyth Contes, SPT. [phone]

## 2015-04-11 NOTE — Evaluation (Signed)
Physical Therapy Evaluation Patient Details Name: [name] MRN: [id number] DOB: 1925-10-20 Today's Date: 04/11/2015   History of Present Illness  Pt is a 81 year old male admitted to the hospital with fever and altered mental status.   Clinical Impression  Pt presents with history of DM, CHF, prostate cancer, nephrolithiasis, CAD, GERD, anxiety, anemia, and HTN. Examination revealed that pt performs bed mobility at min assist for scooting/body positioning and min assist for transfer using RW and slight lifting support. Pt ambulates 12 ft with RW and CGA for safety within the hospital. He takes a long time to ambulate 12 ft, but it is believed that this was close to his premorbid gait speed per conversation with his nurse from home. He appears to be competent in the use of his RW and sequencing. His primary physical deficits include decreased endurance, global weakness, and slightly impaired cognition. Pt will benefit from skilled PT in order to address these deficits and safely return home. Pt good support at home in the form of hospice during the day and lives with his wife.     Follow Up Recommendations Home health PT    Equipment Recommendations   (None needed. Pt states he has everything at home )    Recommendations for Other Services       Precautions / Restrictions Precautions Precautions: Fall Restrictions Weight Bearing Restrictions: No      Mobility  Bed Mobility Overal bed mobility: Needs Assistance Bed Mobility: Sit to Supine       Sit to supine: Min assist   General bed mobility comments: Pt able to perform bed mobility with assist for scooting to get himself properly aligned in bed. It is believed the pt could successfully perform this given more time to complete the task.   Transfers Overall transfer level: Needs assistance Equipment used: Rolling walker (2 wheeled) Transfers: Sit to/from Stand Sit to Stand: Min assist         General transfer  comment: Pt can perform transfers with assist for lifting support. He appears to be competent in proper use of RW. Pt stated that when he would stand up his wife or somebody else would give him a slight lift up.   Ambulation/Gait Ambulation/Gait assistance: Modified independent (Device/Increase time);Min guard Ambulation Distance (Feet): 12 Feet Assistive device: Rolling walker (2 wheeled) Gait Pattern/deviations: Step-to pattern Gait velocity: Slow   General Gait Details: Pt able to ambulate with CGA for safety within the hospital and RW. Pt is able to perform the sequencing independently but he takes a great deal of time to ambulate short distances. Pt's nurse at home called within the session and confirmed to PT that his ambulation is usually very slow. Pt states that this was about how he walked prior to his hospital stay.   Stairs            Wheelchair Mobility    Modified Rankin (Stroke Patients Only)       Balance Overall balance assessment: No apparent balance deficits (not formally assessed)                                           Pertinent Vitals/Pain Pain Assessment: No/denies pain    Home Living Family/patient expects to be discharged to:: Private residence Living Arrangements: Spouse/significant other (hospice in home ) Available Help at Discharge: Family (hospice during the day) Type  of Home: House Home Access: Level entry     Home Layout: One level Home Equipment: Environmental consultant - 2 wheels (States he has all the necessary DME)      Prior Function                 Hand Dominance        Extremity/Trunk Assessment   Upper Extremity Assessment: Generalized weakness           Lower Extremity Assessment: Generalized weakness      Cervical / Trunk Assessment: Kyphotic  Communication   Communication: No difficulties  Cognition Arousal/Alertness: Awake/alert Behavior During Therapy: WFL for tasks assessed/performed Overall  Cognitive Status: Difficult to assess (Delayed response, occasionally forgot question)                      General Comments      Exercises Other Exercises Other Exercises: Pt performed bed exercises x10 bilateral LE at supervision for proper movement. Exercises included: quad sets, glute sets, SLR, hip abd, ankle pumps      Assessment/Plan    PT Assessment Patient needs continued PT services  PT Diagnosis Difficulty walking;Abnormality of gait;Generalized weakness;Altered mental status   PT Problem List Decreased strength;Decreased range of motion;Decreased activity tolerance;Decreased balance;Decreased cognition;Cardiopulmonary status limiting activity  PT Treatment Interventions     PT Goals (Current goals can be found in the Care Plan section) Acute Rehab PT Goals Patient Stated Goal: To go home PT Goal Formulation: With patient Time For Goal Achievement: 04/25/15 Potential to Achieve Goals: Good    Frequency Min 2X/week   Barriers to discharge        Co-evaluation               End of Session Equipment Utilized During Treatment: Gait belt Activity Tolerance: Patient tolerated treatment well Patient left: in bed;with call bell/phone within reach;with bed alarm set Nurse Communication: Mobility status         Time: 8891-6945 PT Time Calculation (min) (ACUTE ONLY): 35 min   Charges:   PT Evaluation $Initial PT Evaluation Tier I: 1 Procedure PT Treatments $Therapeutic Exercise: 8-22 mins   PT G CodesJanyth Contes 04-14-15, 2:17 PM  Janyth Contes, SPT. [phone]

## 2015-04-11 NOTE — Progress Notes (Signed)
Alert, rested quietly most of the day. Visitors at bedside. No complaints. 7 beats of VTach this morning, asymptomatic - Dr. Doy Hutching notified: no interventions, continue to monitor.  Toniann Ket

## 2015-04-12 LAB — COMPREHENSIVE METABOLIC PANEL
ALK PHOS: 176 U/L — AB (ref 38–126)
ALT: 34 U/L (ref 17–63)
AST: 50 U/L — AB (ref 15–41)
Albumin: 2.2 g/dL — ABNORMAL LOW (ref 3.5–5.0)
Anion gap: 7 (ref 5–15)
BILIRUBIN TOTAL: 0.7 mg/dL (ref 0.3–1.2)
BUN: 30 mg/dL — AB (ref 6–20)
CO2: 22 mmol/L (ref 22–32)
Calcium: 8.7 mg/dL — ABNORMAL LOW (ref 8.9–10.3)
Chloride: 110 mmol/L (ref 101–111)
Creatinine, Ser: 0.94 mg/dL (ref 0.61–1.24)
Glucose, Bld: 106 mg/dL — ABNORMAL HIGH (ref 65–99)
Potassium: 3.9 mmol/L (ref 3.5–5.1)
Sodium: 139 mmol/L (ref 135–145)
Total Protein: 5.4 g/dL — ABNORMAL LOW (ref 6.5–8.1)

## 2015-04-12 LAB — CBC WITH DIFFERENTIAL/PLATELET
Basophils Absolute: 0 10*3/uL (ref 0–0.1)
Basophils Relative: 0 %
Eosinophils Absolute: 0.1 10*3/uL (ref 0–0.7)
Eosinophils Relative: 2 %
HCT: 34.6 % — ABNORMAL LOW (ref 40.0–52.0)
HEMOGLOBIN: 11.7 g/dL — AB (ref 13.0–18.0)
Lymphocytes Relative: 8 %
Lymphs Abs: 0.8 10*3/uL — ABNORMAL LOW (ref 1.0–3.6)
MCH: 30.3 pg (ref 26.0–34.0)
MCHC: 33.9 g/dL (ref 32.0–36.0)
MCV: 89.3 fL (ref 80.0–100.0)
MONO ABS: 0.9 10*3/uL (ref 0.2–1.0)
Monocytes Relative: 9 %
Neutro Abs: 7.8 10*3/uL — ABNORMAL HIGH (ref 1.4–6.5)
Neutrophils Relative %: 81 %
PLATELETS: 153 10*3/uL (ref 150–440)
RBC: 3.88 MIL/uL — ABNORMAL LOW (ref 4.40–5.90)
RDW: 16.7 % — AB (ref 11.5–14.5)
WBC: 9.7 10*3/uL (ref 3.8–10.6)

## 2015-04-12 LAB — URINE CULTURE: CULTURE: NO GROWTH

## 2015-04-12 LAB — GLUCOSE, CAPILLARY
GLUCOSE-CAPILLARY: 123 mg/dL — AB (ref 65–99)
GLUCOSE-CAPILLARY: 125 mg/dL — AB (ref 65–99)
Glucose-Capillary: 103 mg/dL — ABNORMAL HIGH (ref 65–99)
Glucose-Capillary: 174 mg/dL — ABNORMAL HIGH (ref 65–99)

## 2015-04-12 NOTE — Care Management (Signed)
Spoke with patient's hospice nurse.  Agency sent patient to the hospice to calm family after receiving multiple calls Monday and Tuesday.  When made another home visit on Wednesday patient had fever and family wanted patient to be treated.  Updated agency and weekend caremanager will notified of discharges if discharges over the weekend

## 2015-04-12 NOTE — Progress Notes (Signed)
Physical Therapy Treatment Patient Details Name: [name] MRN: [id number] DOB: [birth year] Today's Date: 04/12/2015    History of Present Illness Pt is a 81 year old male admitted to the hospital with fever and altered mental status.     PT Comments    Pt appears to be a bit weaker today, primarily with transfers, requiring max assist and increased time to complete sit -> stand. His bed mobility and ambulation appears about the same as yesterday, but the decrease in sit -> stand performance concerns PT concerning previous d/c recommendation for HHPT. Pt currently has hospice, but per pt hx they are not there at night. PT is concerned that this decline in performance may lead to a fall without 24 hr assistance. Pt remains motivated to perform therapy and was able to perform more reps of his there-ex today, which is a positive indicator for rehab potential.   Follow Up Recommendations  Home health PT;Supervision/Assistance - 24 hour (Requires 24 hr assistance for prevention of falls)     Equipment Recommendations  None recommended by PT    Recommendations for Other Services       Precautions / Restrictions Precautions Precautions: Fall Restrictions Weight Bearing Restrictions: No    Mobility  Bed Mobility Overal bed mobility: Needs Assistance Bed Mobility: Supine to Sit     Supine to sit: Min assist Sit to supine: Min assist   General bed mobility comments: Pt able to perform bed mobility with assist for scooting to get himself properly aligned in bed. It is believed the pt could successfully perform this given more time to complete the task.   Transfers Overall transfer level: Needs assistance Equipment used: Rolling walker (2 wheeled) Transfers: Sit to/from Stand Sit to Stand: Max assist         General transfer comment: Pt transfers are much more labored today. He appears weaker than yesterday. He attempted sit -> stand with RW and max assist but was not able to  get his legs under him even with heavy cueing and tactile cues to get hips forward. He needed to sit after 10 seconds of attempting it. He tried it again after a rest and was able to complete it and balance himself after another 6 seconds or so.   Ambulation/Gait Ambulation/Gait assistance: Min guard Ambulation Distance (Feet): 15 Feet Assistive device: Rolling walker (2 wheeled) Gait Pattern/deviations: Step-through pattern (Very slow) Gait velocity: Slow   General Gait Details: Pt still ambulates with decreased time and requires verbal cueing for stepping in order to promote a rhythm. Pt doesn't not too much fatigue until he is finally sitting again. It is not believed that this is too far off of his baseline.    Stairs            Wheelchair Mobility    Modified Rankin (Stroke Patients Only)       Balance Overall balance assessment: Needs assistance Sitting-balance support: Bilateral upper extremity supported;Feet supported       Standing balance support: Bilateral upper extremity supported (Tends to lean posteriorly)                        Cognition Arousal/Alertness: Awake/alert Behavior During Therapy: WFL for tasks assessed/performed Overall Cognitive Status: Difficult to assess (still slower response )                      Exercises Other Exercises Other Exercises: Pt performed bed exercises x12 bilateral  LE at supervision for proper movement. Exercises included: quad sets, glute sets, SLR, hip abd, ankle pumps    General Comments        Pertinent Vitals/Pain Pain Assessment: No/denies pain    Home Living                      Prior Function            PT Goals (current goals can now be found in the care plan section) Acute Rehab PT Goals Patient Stated Goal: To do therapy PT Goal Formulation: With patient Time For Goal Achievement: 04/25/15 Potential to Achieve Goals: Fair Progress towards PT goals: Not progressing  toward goals - comment (appears weaker today with tasks)    Frequency  Min 2X/week    PT Plan Discharge plan needs to be updated    Co-evaluation             End of Session Equipment Utilized During Treatment: Gait belt Activity Tolerance: Patient limited by fatigue Patient left: in bed;with call bell/phone within reach;with bed alarm set     Time: 6979-4801 PT Time Calculation (min) (ACUTE ONLY): 25 min  Charges:                       G CodesJanyth Contes 2015-05-03, 4:50 PM  Janyth Contes, SPT. [phone]

## 2015-04-12 NOTE — Care Management (Signed)
Important Message  Patient Details  Name: [name] MRN: [id number] Date of Birth: Mar 22, 1926   Medicare Important Message Given:  Yes-second notification given    Juliann Pulse A Allmond 04/12/2015, 10:56 AM

## 2015-04-12 NOTE — Clinical Documentation Improvement (Signed)
Supporting Information: Patient with fever and AMS per 6/29 progress notes.   Possible Clinical Condition: . Document the etiology of the altered mental status as: --Coma --Confusion/delirium (including drug-induced) --Drowsiness/somnolence --Stupor/semi-coma --Transient alteration of awareness --Encephalopathy Alcoholic Anoxic/hypoxic Drug-induced/toxic (specify drug) Hepatic Hypertensive Hypoglycemic Metabolic/septic Traumatic/post-concussion Wernicke Other (specify) . Document any associated diagnoses/conditions    Thank Sherian Maroon Documentation Specialist [phone] [email or web address]

## 2015-04-12 NOTE — Progress Notes (Signed)
[name] is a 81 y.o. male  <principal problem not specified>   SUBJECTIVE:  Pt stable without complaints. Afebrile. WBC normal. Tachycardia resolved. Gram negative rods in blood/urine. On IV Rocephin. Stable on RA. Some rare runs of non-sustained V-tach. Denies CP or SOB. Working with PT.  ______________________________________________________________________  ROS: Review of systems is unremarkable for any active cardiac,respiratory, GI, GU, hematologic, neurologic or psychiatric systems, 10 systems reviewed.  @CMEDLIST @  Past Medical History  Diagnosis Date  . CAD (coronary artery disease)   . Diabetes mellitus, type 2   . GERD (gastroesophageal reflux disease)   . Hyperlipidemia   . Anxiety   . Anemia     iron deficient a. ? h/o GI bleed  . Nephrolithiasis   . Prostate cancer   . Nephrolithiasis   . CHF (congestive heart failure)   . Hypertension     Past Surgical History  Procedure Laterality Date  . Cardiac bypass surgery  2002    five vessel  . Radiation seed implants for prostate cancer    . H/o esoph dilatation      x 2 in past  . Left thr  2004  . Hernia repair  1994  . Total hip arthroplasty  2004    PHYSICAL EXAM:  BP 149/94 mmHg  Pulse 86  Temp(Src) 98 F (36.7 C) (Oral)  Resp 16  Ht 5\' 11"  (1.803 m)  Wt 61.009 kg (134 lb 8 oz)  BMI 18.77 kg/m2  SpO2 99%  Wt Readings from Last 3 Encounters:  04/12/15 61.009 kg (134 lb 8 oz)  07/05/14 65.318 kg (144 lb)  11/06/13 65.545 kg (144 lb 8 oz)            Constitutional: NAD Neck: supple, no thyromegaly Respiratory: CTA, no rales or wheezes Cardiovascular: RRR, 2/6 systolic murmur, no gallop Abdomen: soft, good BS, nontender Extremities: no edema Neuro: alert and oriented, no focal motor or sensory deficits  ASSESSMENT/PLAN:  Labs and imaging studies were reviewed  Will continue IV ABX. Awaiting sensitivities. Continue PT. Repeat labs in AM. Pt is DNR. Plan D/C home with Hospice once IV  ABX therapy complete.

## 2015-04-13 DIAGNOSIS — R509 Fever, unspecified: Secondary | ICD-10-CM | POA: Diagnosis present

## 2015-04-13 DIAGNOSIS — B961 Klebsiella pneumoniae [K. pneumoniae] as the cause of diseases classified elsewhere: Secondary | ICD-10-CM | POA: Diagnosis present

## 2015-04-13 DIAGNOSIS — R7881 Bacteremia: Secondary | ICD-10-CM | POA: Diagnosis present

## 2015-04-13 LAB — CBC WITH DIFFERENTIAL/PLATELET
BASOS ABS: 0 10*3/uL (ref 0–0.1)
BASOS PCT: 0 %
EOS ABS: 0.2 10*3/uL (ref 0–0.7)
Eosinophils Relative: 3 %
HCT: 37.1 % — ABNORMAL LOW (ref 40.0–52.0)
Hemoglobin: 12.4 g/dL — ABNORMAL LOW (ref 13.0–18.0)
LYMPHS PCT: 12 %
Lymphs Abs: 0.9 10*3/uL — ABNORMAL LOW (ref 1.0–3.6)
MCH: 29.9 pg (ref 26.0–34.0)
MCHC: 33.4 g/dL (ref 32.0–36.0)
MCV: 89.5 fL (ref 80.0–100.0)
MONOS PCT: 10 %
Monocytes Absolute: 0.8 10*3/uL (ref 0.2–1.0)
NEUTROS ABS: 5.9 10*3/uL (ref 1.4–6.5)
Neutrophils Relative %: 75 %
PLATELETS: 175 10*3/uL (ref 150–440)
RBC: 4.15 MIL/uL — AB (ref 4.40–5.90)
RDW: 16.2 % — ABNORMAL HIGH (ref 11.5–14.5)
WBC: 7.8 10*3/uL (ref 3.8–10.6)

## 2015-04-13 LAB — BASIC METABOLIC PANEL
ANION GAP: 4 — AB (ref 5–15)
BUN: 27 mg/dL — ABNORMAL HIGH (ref 6–20)
CO2: 25 mmol/L (ref 22–32)
CREATININE: 0.91 mg/dL (ref 0.61–1.24)
Calcium: 8.7 mg/dL — ABNORMAL LOW (ref 8.9–10.3)
Chloride: 110 mmol/L (ref 101–111)
GFR calc Af Amer: >60 mL/min (ref >60)
GFR calc non Af Amer: >60 mL/min (ref >60)
Glucose, Bld: 121 mg/dL — ABNORMAL HIGH (ref 65–99)
POTASSIUM: 4 mmol/L (ref 3.5–5.1)
Sodium: 139 mmol/L (ref 135–145)

## 2015-04-13 LAB — GLUCOSE, CAPILLARY
GLUCOSE-CAPILLARY: 106 mg/dL — AB (ref 65–99)
Glucose-Capillary: 133 mg/dL — ABNORMAL HIGH (ref 65–99)
Glucose-Capillary: 111 mg/dL — ABNORMAL HIGH (ref 65–99)
Glucose-Capillary: 144 mg/dL — ABNORMAL HIGH (ref 65–99)

## 2015-04-13 LAB — CULTURE, BLOOD (ROUTINE X 2)

## 2015-04-13 MED ORDER — SULFAMETHOXAZOLE-TRIMETHOPRIM 800-160 MG PO TABS: 1.0000 | ORAL_TABLET | Freq: Two times a day (BID) | ORAL | Status: DC

## 2015-04-13 MED ORDER — SULFAMETHOXAZOLE-TRIMETHOPRIM 800-160 MG PO TABS
1.0000 | ORAL_TABLET | Freq: Two times a day (BID) | ORAL | Status: DC
  Administered 2015-04-13 – 2015-04-15 (×5): 1 via ORAL
  Filled 2015-04-13 (×5): qty 1

## 2015-04-13 NOTE — Clinical Social Work Note (Signed)
Clinical Social Work Assessment  Patient Details  Name: [name] MRN: [id number] Date of Birth: 09/04/1926  Date of referral:  04/13/15               Reason for consult:  Facility Placement                Permission sought to share information with:  Chartered certified accountant granted to share information::  Yes, Verbal Permission Granted  Name::      [place]::   [place]   Relationship::     Contact Information:     Housing/Transportation Living arrangements for the past 2 months:  Single Family Home Source of Information:  Adult Children, Spouse Patient Interpreter Needed:  None Criminal Activity/Legal Involvement Pertinent to Current Situation/Hospitalization:  No - Comment as needed Significant Relationships:  Adult Children, Spouse Lives with:  Spouse Do you feel safe going back to the place where you live?  Yes Need for family participation in patient care:  Yes (Comment)  Care giving concerns:  Patient lives with is wife in [place].    Social Worker assessment / plan:  Holiday representative (CSW) received SNF consult. Plan was to take patient home with hospice however wife and daughter prefer rehab. CSW contacted patient's daughter Opal Sidles [phone]). Per daughter patient lives with his wife in [place] and has hospice services at home. Daughter wants patient to go to rehab. CSW explained to daughter that hospice will have to discharge patient in order for him to go to rehab and patient will have to participate in PT. Daughter verbalized her understanding. CSW also met with patient's wife at bedside. Wife is agreeable to SNF search in [facility] and prefers [facility] or [facility]. Wife is agreeable to hospice pulling services for rehab.   FL2 complete and faxed out.   Employment status:  Retired Nurse, adult PT Recommendations:  [address] [place] / Referral to  community resources:  [place]  Patient/Family's Response to care:  Patient, daughter and wife are agreeable to SNF.   Patient/Family's Understanding of and Emotional Response to Diagnosis, Current Treatment, and Prognosis:  Patient and wife were pleasant throughout assessment. Daughter thanked CSW for visit and assisting with placement.   Emotional Assessment Appearance:  Appears stated age Attitude/Demeanor/Rapport:    Affect (typically observed):  Pleasant, Quiet Orientation:  Oriented to Self, Oriented to Place, Oriented to  Time Alcohol / Substance use:  Not Applicable Psych involvement (Current and /or in the community):  No (Comment)  Discharge Needs  Concerns to be addressed:  Discharge Planning Concerns Readmission within the last 30 days:  No Current discharge risk:  Chronically ill Barriers to Discharge:  Continued Medical Work up   Loralyn Freshwater, LCSW 04/13/2015, 5:19 PM

## 2015-04-13 NOTE — Progress Notes (Signed)
[name] is a 81 y.o. male  <principal problem not specified>   SUBJECTIVE:  Pt stable without complaints. Afebrile. WBC normal. Tachycardia resolved. Gram negative rods in blood/urine. On IV Rocephin.  7/2 - feels good - eating some, needs help to get oob. No fevers chills.  ______________________________________________________________________  ROS: Review of systems is unremarkable for any active cardiac,respiratory, GI, GU, hematologic, neurologic or psychiatric systems, 10 systems reviewed.  Scheduled Meds: . allopurinol  100 mg Oral Daily  . aspirin EC  81 mg Oral Daily  . carvedilol  12.5 mg Oral BID  . cefTRIAXone (ROCEPHIN)  IV  2 g Intravenous Q24H  . docusate sodium  100 mg Oral BID  . enoxaparin (LOVENOX) injection  40 mg Subcutaneous Q24H  . insulin aspart  0-15 Units Subcutaneous TID WC  . insulin aspart  0-5 Units Subcutaneous QHS  . pantoprazole  40 mg Oral Daily  . sodium chloride  3 mL Intravenous Q12H   Continuous Infusions:  PRN Meds:.acetaminophen **OR** acetaminophen, albuterol, ondansetron **OR** ondansetron (ZOFRAN) IV   Past Medical History  Diagnosis Date  . CAD (coronary artery disease)   . Diabetes mellitus, type 2   . GERD (gastroesophageal reflux disease)   . Hyperlipidemia   . Anxiety   . Anemia     iron deficient a. ? h/o GI bleed  . Nephrolithiasis   . Prostate cancer   . Nephrolithiasis   . CHF (congestive heart failure)   . Hypertension     Past Surgical History  Procedure Laterality Date  . Cardiac bypass surgery  2002    five vessel  . Radiation seed implants for prostate cancer    . H/o esoph dilatation      x 2 in past  . Left thr  2004  . Hernia repair  1994  . Total hip arthroplasty  2004    PHYSICAL EXAM:  BP 174/70 mmHg  Pulse 61  Temp(Src) 97.5 F (36.4 C) (Oral)  Resp 18  Ht 5\' 11"  (1.803 m)  Wt 61.009 kg (134 lb 8 oz)  BMI 18.77 kg/m2  SpO2 99%  Wt Readings from Last 3 Encounters:  04/12/15 61.009  kg (134 lb 8 oz)  07/05/14 65.318 kg (144 lb)  11/06/13 65.545 kg (144 lb 8 oz)            Constitutional: NAD frail, hunched over in bed Neck: supple, no thyromegaly Respiratory: CTA, no rales or wheezes Cardiovascular: RRR, 2/6 systolic murmur, no gallop Abdomen: soft, good BS, nontender Extremities: no edema Neuro: alert and oriented, no focal motor or sensory deficits  Lab Results  Component Value Date   WBC 7.8 04/13/2015   HGB 12.4* 04/13/2015   HCT 37.1* 04/13/2015   MCV 89.5 04/13/2015   PLT 175 04/13/2015    Lab Results  Component Value Date   CREATININE 0.91 04/13/2015   BUN 27* 04/13/2015   NA 139 04/13/2015   K 4.0 04/13/2015   CL 110 04/13/2015   CO2 25 04/13/2015   Results for orders placed or performed during the hospital encounter of 04/10/15  Culture, blood (routine x 2)     Status: None (Preliminary result)   Collection Time: 04/10/15  1:42 PM  Result Value Ref Range Status   Specimen Description BLOOD  Final   Special Requests NONE  Final   Culture  Setup Time   Final    GRAM NEGATIVE RODS IN BOTH AEROBIC AND ANAEROBIC BOTTLES CRITICAL RESULT CALLED TO, READ BACK  BY AND VERIFIED WITH: Glorious Peach HOPKINS AT [id number] 04/11/15 DV    Culture KLEBSIELLA PNEUMONIAE  Final   Report Status PENDING  Incomplete   Organism ID, Bacteria KLEBSIELLA PNEUMONIAE  Final      Susceptibility   Klebsiella pneumoniae - MIC*    AMPICILLIN 16 RESISTANT Resistant     CEFTAZIDIME <=1 SENSITIVE Sensitive     CEFAZOLIN <=4 SENSITIVE Sensitive     CEFTRIAXONE <=1 SENSITIVE Sensitive     CIPROFLOXACIN <=0.25 SENSITIVE Sensitive     GENTAMICIN <=1 SENSITIVE Sensitive     IMIPENEM <=0.25 SENSITIVE Sensitive     TRIMETH/SULFA <=20 SENSITIVE Sensitive     CEFOXITIN <=4 SENSITIVE Sensitive     PIP/TAZO Value in next row Sensitive      SENSITIVE<=4    LEVOFLOXACIN Value in next row Sensitive      SENSITIVE<=0.12    * KLEBSIELLA PNEUMONIAE  Urine culture     Status: None    Collection Time: 04/10/15  1:42 PM  Result Value Ref Range Status   Specimen Description URINE, RANDOM  Final   Special Requests NONE  Final   Culture NO GROWTH 2 DAYS  Final   Report Status 04/12/2015 FINAL  Final  Culture, blood (routine x 2)     Status: None   Collection Time: 04/10/15  1:43 PM  Result Value Ref Range Status   Specimen Description BLOOD  Final   Special Requests NONE  Final   Culture  Setup Time   Final    GRAM NEGATIVE RODS IN BOTH AEROBIC AND ANAEROBIC BOTTLES CRITICAL VALUE NOTED.  VALUE IS CONSISTENT WITH PREVIOUSLY REPORTED AND CALLED VALUE.    Culture KLEBSIELLA PNEUMONIAE  Final   Report Status 04/13/2015 FINAL  Final   Organism ID, Bacteria KLEBSIELLA PNEUMONIAE  Final      Susceptibility   Klebsiella pneumoniae - MIC*    AMPICILLIN 16 RESISTANT Resistant     CEFTAZIDIME <=1 SENSITIVE Sensitive     CEFAZOLIN <=4 SENSITIVE Sensitive     CEFTRIAXONE <=1 SENSITIVE Sensitive     CIPROFLOXACIN <=0.25 SENSITIVE Sensitive     GENTAMICIN <=1 SENSITIVE Sensitive     IMIPENEM <=0.25 SENSITIVE Sensitive     TRIMETH/SULFA <=20 SENSITIVE Sensitive     CEFOXITIN <=4 SENSITIVE Sensitive     PIP/TAZO Value in next row Sensitive      SENSITIVE<=4    LEVOFLOXACIN Value in next row Sensitive      SENSITIVE<=0.12    * KLEBSIELLA PNEUMONIAE    ASSESSMENT/PLAN: Klebsiella PNA bacteremia - unclear source since UA with no wbc, UCX neg and CXR negative  change to bactrim DS for total 14 day course total  ARF cr improved to 0.91 from 1.28 with IVF  Dispo  - PT rec hh PT Pt is DNR. DC tele monitoring Plan D/C home today with Hospice - spoke to wife - she can arrange care she thinks - will ask case management to assist

## 2015-04-13 NOTE — Care Management Note (Signed)
Case Management Note  Patient Details  Name: ELWIN TSOU MRN: [id number] Date of Birth: 1926/01/31  Subjective/Objective:      Discharge order for home health PT faxed and called to [facility].               Action/Plan:   Expected Discharge Date:  04/11/15               Expected Discharge Plan:     In-House Referral:     Discharge planning Services     Post Acute Care Choice:    Choice offered to:     DME Arranged:    DME Agency:     HH Arranged:    [place] Agency:     Status of Service:     Medicare Important Message Given:  Yes-second notification given Date Medicare IM Given:    Medicare IM give by:    Date Additional Medicare IM Given:    Additional Medicare Important Message give by:     If discussed at [place] of Stay Meetings, dates discussed:    Additional Comments:  Rohit Deloria A, RN 04/13/2015, 9:40 AM

## 2015-04-13 NOTE — Discharge Instructions (Signed)
Antibiotic Medication °Antibiotic medicine helps fight germs. Germs cause infections. This type of medicine will not work for colds, flu, or other viral infections. Tell your doctor if you: °· Are allergic to any medicines. °· Are pregnant or are trying to get pregnant. °· Are taking other medicines. °· Have other medical problems. °HOME CARE °· Take your medicine with a glass of water or food as told by your doctor. °· Take the medicine as told. Finish them even if you start to feel better. °· Do not give your medicine to other people. °· Do not use your medicine in the future for a different infection. °· Ask your doctor about which side effects to watch for. °· Try not to miss any doses. If you miss a dose, take it as soon as possible. If it is almost time for your next dose, and your dosing schedule is: °¨ Two doses a day, take the missed dose and the next dose 5 to 6 hours later. °¨ Three or more doses a day, take the missed dose and the next dose 2 to 4 hours later, or double your next dose. °¨ Then go back to your normal schedule. °GET HELP RIGHT AWAY IF:  °· You get worse or do not get better within a few days. °· The medicine makes you sick. °· You develop a rash or any other side effects. °· You have questions or concerns. °MAKE SURE YOU: °· Understand these instructions. °· Will watch your condition. °· Will get help right away if you are not doing well or get worse. °Document Released: 07/07/2008 Document Revised: 12/21/2011 Document Reviewed: 09/03/2009 °ExitCare® Patient Information ©2015 [employer]. This information is not intended to replace advice given to you by your health care provider. Make sure you discuss any questions you have with your health care provider. ° °

## 2015-04-13 NOTE — Progress Notes (Signed)
Spoke with patients wife deloris to confirm a sitter/caregiver will be at the house 24/7 prior to letting patient be discharged. Was made aware by patients wife she has sitters however not 24/7, per mds order patient is to have 24hrs assistance to prevent falls. Informed wife i will talk to case manager and md to made aware of issue. I was able to discuss the issue with lynn the case manager to make aware that patient does not currently have 24hrs assistance at home, was asked if the wife could care for the patient on her own. Made Jeani Hawking aware that patient wife would not be able to care for the patient because as of right now it takes two person assist to get patient up to the chair. Also talked to Dr. Ola Spurr to make aware of the issue, he also asked if patients wife could care for the patient and have care manger call hospice to see if they could arrange 24hrs assistance. After Jeani Hawking spoke with hospice and was unable to arrange 24hrs assistance, dr fitzgerald was made aware. MD stated discharge will have to be suspended until 24hrs assistance can be provided [employer]

## 2015-04-13 NOTE — Discharge Summary (Addendum)
Physician Discharge Summary  Patient ID: [name] MRN: [id number] DOB/AGE: 1926/07/23 81 y.o.  Admit date: 04/10/2015 Discharge date: 04/15/15  Admission Diagnoses:  Discharge Diagnoses:  Active Problems:   Sepsis   Fever   Bacteremia due to Klebsiella pneumoniae   Pressure ulcer   Discharged Condition: fair  Hospital Course:  Admitted with fever and sepsis.  Bcx + Klebsiella. Treated with ceftraixone. Clinically improved. Initial ARF resovled with IVF.  Seen by PT - is on hospice- rec hh PT  Will be discharged on oral bactrim DS bid for 10 more day  Dysphagia- seen by SS-  Diet as below  Consults: None  Significant Diagnostic Studies: microbiology: blood culture: positive for Klebsiella  Treatments: IV hydration and antibiotics: ceftriaxone   Discharge Exam: Blood pressure 113/54, pulse 64, temperature 97.8 F (36.6 C), temperature source Oral, resp. rate 20, height 5\' 11"  (1.803 m), weight 61.009 kg (134 lb 8 oz), SpO2 97 %. See PN   Disposition: SNF  DIET Dysphagia 2 (Fine chop);Thin   Medication Administration: Whole meds with puree Compensations: Slow rate;Small sips/bites (time b/t bites/sips)        Discharge Instructions    DIET DYS 2    Complete by:  As directed   Fluid consistency:  Thin  Meds in Puree - broken down or crushed if nec./able; liquid forms as well.  Tray setup at all meals and monitoring for any other assistance nec.  Moisten foods well w/ Gravy on potatoes and meats - trials to upgrade to mech soft foods(Dys. 3) when appropriate.            Medication List    TAKE these medications        allopurinol 100 MG tablet  Commonly known as:  ZYLOPRIM  Take 100 mg by mouth daily.     amLODipine 5 MG tablet  Commonly known as:  NORVASC  Take 5 mg by mouth daily.     carvedilol 12.5 MG tablet  Commonly known as:  COREG  Take 12.5 mg by mouth 2 (two) times daily.     omeprazole 20 MG capsule  Commonly known as:  PRILOSEC   Take 20 mg by mouth daily.     sulfamethoxazole-trimethoprim 800-160 MG per tablet  Commonly known as:  BACTRIM DS,SEPTRA DS  Take 1 tablet by mouth every 12 (twelve) hours.       Follow-up Information    Follow up with SPARKS,JEFFREY D, MD In 2 weeks.   Specialty:  Internal Medicine   Contact information:   [place] [address] [phone]       Signed: [place], [place] 04/15/2015, 3:53 PM

## 2015-04-13 NOTE — Clinical Social Work Placement (Signed)
   CLINICAL SOCIAL WORK PLACEMENT  NOTE  Date:  04/13/2015  Patient Details  Name: [name] MRN: [id number] Date of Birth: [birth year]  Clinical Social Work is seeking post-discharge placement for this patient at the [place] level of care (*CSW will initial, date and re-position this form in  chart as items are completed):  Yes   Patient/family provided with [facility] Work Department's list of facilities offering this level of care within the geographic area requested by the patient (or if unable, by the patient's family).  Yes   Patient/family informed of their freedom to choose among providers that offer the needed level of care, that participate in Medicare, Medicaid or managed care program needed by the patient, have an available bed and are willing to accept the patient.  Yes   Patient/family informed of [facility]'s ownership interest in [facility] and [facility], as well as of the fact that they are under no obligation to receive care at these facilities.  PASRR submitted to EDS on 04/13/15     PASRR number received on 04/13/15     Existing PASRR number confirmed on       FL2 transmitted to all facilities in geographic area requested by pt/family on 04/13/15     FL2 transmitted to all facilities within larger geographic area on       Patient informed that his/her managed care company has contracts with or will negotiate with certain facilities, including the following:            Patient/family informed of bed offers received.  Patient chooses bed at       Physician recommends and patient chooses bed at      Patient to be transferred to   on  .  Patient to be transferred to facility by       Patient family notified on   of transfer.  Name of family member notified:        PHYSICIAN Please sign FL2     Additional Comment:    _______________________________________________ Loralyn Freshwater, LCSW 04/13/2015, 5:18  PM

## 2015-04-13 NOTE — Care Management Note (Signed)
Case Management Note  Patient Details  Name: [name] MRN: [id number] Date of Birth: January 25, 1926  Subjective/Objective:        Discussed home care arrangements for the weekend with both [facility] Maudie Mercury) and with Dr Ola Spurr. Wife is unable to care for [name] herself 24/7 and prior to this hospitalization had 1st and 2nd shift sitters to assist her. She did not know that [name] was being discharged this weekend and has let the sitters off for the weekend. Hospice cannot provide continuous assistance for 2 shifts daily in the home. Therefore, until [name] can contact her sitters this weekend, [name] cannot be discharged home per Dr Ola Spurr.             Action/Plan:   Expected Discharge Date:  04/11/15               Expected Discharge Plan:     In-House Referral:     Discharge planning Services     Post Acute Care Choice:    Choice offered to:     DME Arranged:    DME Agency:     HH Arranged:    [place] Agency:     Status of Service:     Medicare Important Message Given:  Yes-second notification given Date Medicare IM Given:    Medicare IM give by:    Date Additional Medicare IM Given:    Additional Medicare Important Message give by:     If discussed at [place] of Stay Meetings, dates discussed:    Additional Comments:  Elvira Langston A, RN 04/13/2015, 11:04 AM

## 2015-04-14 LAB — CULTURE, BLOOD (ROUTINE X 2)

## 2015-04-14 LAB — GLUCOSE, CAPILLARY
GLUCOSE-CAPILLARY: 192 mg/dL — AB (ref 65–99)
Glucose-Capillary: 90 mg/dL (ref 65–99)
Glucose-Capillary: 156 mg/dL — ABNORMAL HIGH (ref 65–99)
Glucose-Capillary: 122 mg/dL — ABNORMAL HIGH (ref 65–99)

## 2015-04-14 NOTE — Progress Notes (Addendum)
[name] is a 81 y.o. male  <principal problem not specified>   SUBJECTIVE:  Pt stable without complaints. Afebrile. WBC normal. Tachycardia resolved. Gram negative rods in blood/urine. On IV Rocephin.  7/2 - feels good - eating some, needs help to get oob. No fevers chills.  7/3  Changed to po bactrim. No fevers, urinating with very mild dysuria ______________________________________________________________________  ROS: Review of systems is unremarkable for any active cardiac,respiratory, GI, GU, hematologic, neurologic or psychiatric systems, 10 systems reviewed.  Scheduled Meds: . allopurinol  100 mg Oral Daily  . aspirin EC  81 mg Oral Daily  . carvedilol  12.5 mg Oral BID  . docusate sodium  100 mg Oral BID  . insulin aspart  0-15 Units Subcutaneous TID WC  . insulin aspart  0-5 Units Subcutaneous QHS  . pantoprazole  40 mg Oral Daily  . sodium chloride  3 mL Intravenous Q12H  . sulfamethoxazole-trimethoprim  1 tablet Oral Q12H   Continuous Infusions:  PRN Meds:.acetaminophen **OR** acetaminophen, albuterol, ondansetron **OR** ondansetron (ZOFRAN) IV   Past Medical History  Diagnosis Date  . CAD (coronary artery disease)   . Diabetes mellitus, type 2   . GERD (gastroesophageal reflux disease)   . Hyperlipidemia   . Anxiety   . Anemia     iron deficient a. ? h/o GI bleed  . Nephrolithiasis   . Prostate cancer   . Nephrolithiasis   . CHF (congestive heart failure)   . Hypertension     Past Surgical History  Procedure Laterality Date  . Cardiac bypass surgery  2002    five vessel  . Radiation seed implants for prostate cancer    . H/o esoph dilatation      x 2 in past  . Left thr  2004  . Hernia repair  1994  . Total hip arthroplasty  2004    PHYSICAL EXAM:  BP 123/67 mmHg  Pulse 67  Temp(Src) 97.5 F (36.4 C) (Oral)  Resp 20  Ht 5\' 11"  (1.803 m)  Wt 61.009 kg (134 lb 8 oz)  BMI 18.77 kg/m2  SpO2 99%  Wt Readings from Last 3 Encounters:   04/12/15 61.009 kg (134 lb 8 oz)  07/05/14 65.318 kg (144 lb)  11/06/13 65.545 kg (144 lb 8 oz)            Constitutional: NAD frail, hunched over in bed Neck: supple, no thyromegaly Respiratory: CTA, no rales or wheezes Cardiovascular: RRR, 2/6 systolic murmur, no gallop Abdomen: soft, good BS, nontender Extremities: no edema Neuro: alert and oriented, no focal motor or sensory deficits  Lab Results  Component Value Date   WBC 7.8 04/13/2015   HGB 12.4* 04/13/2015   HCT 37.1* 04/13/2015   MCV 89.5 04/13/2015   PLT 175 04/13/2015    Lab Results  Component Value Date   CREATININE 0.91 04/13/2015   BUN 27* 04/13/2015   NA 139 04/13/2015   K 4.0 04/13/2015   CL 110 04/13/2015   CO2 25 04/13/2015   Results for orders placed or performed during the hospital encounter of 04/10/15  Culture, blood (routine x 2)     Status: None   Collection Time: 04/10/15  1:42 PM  Result Value Ref Range Status   Specimen Description BLOOD  Final   Special Requests NONE  Final   Culture  Setup Time   Final    GRAM NEGATIVE RODS IN BOTH AEROBIC AND ANAEROBIC BOTTLES CRITICAL RESULT CALLED TO, READ BACK BY AND  VERIFIED WITH: Kennedy Bucker AT 8921 04/11/15 DV    Culture   Final    KLEBSIELLA PNEUMONIAE IN BOTH AEROBIC AND ANAEROBIC BOTTLES    Report Status 04/14/2015 FINAL  Final   Organism ID, Bacteria KLEBSIELLA PNEUMONIAE  Final      Susceptibility   Klebsiella pneumoniae - MIC*    AMPICILLIN 16 RESISTANT Resistant     CEFTAZIDIME <=1 SENSITIVE Sensitive     CEFAZOLIN <=4 SENSITIVE Sensitive     CEFTRIAXONE <=1 SENSITIVE Sensitive     CIPROFLOXACIN <=0.25 SENSITIVE Sensitive     GENTAMICIN <=1 SENSITIVE Sensitive     IMIPENEM <=0.25 SENSITIVE Sensitive     TRIMETH/SULFA <=20 SENSITIVE Sensitive     CEFOXITIN <=4 SENSITIVE Sensitive     PIP/TAZO Value in next row Sensitive      SENSITIVE<=4    LEVOFLOXACIN Value in next row Sensitive      SENSITIVE<=0.12    * KLEBSIELLA  PNEUMONIAE  Urine culture     Status: None   Collection Time: 04/10/15  1:42 PM  Result Value Ref Range Status   Specimen Description URINE, RANDOM  Final   Special Requests NONE  Final   Culture NO GROWTH 2 DAYS  Final   Report Status 04/12/2015 FINAL  Final  Culture, blood (routine x 2)     Status: None   Collection Time: 04/10/15  1:43 PM  Result Value Ref Range Status   Specimen Description BLOOD  Final   Special Requests NONE  Final   Culture  Setup Time   Final    GRAM NEGATIVE RODS IN BOTH AEROBIC AND ANAEROBIC BOTTLES CRITICAL VALUE NOTED.  VALUE IS CONSISTENT WITH PREVIOUSLY REPORTED AND CALLED VALUE.    Culture KLEBSIELLA PNEUMONIAE  Final   Report Status 04/13/2015 FINAL  Final   Organism ID, Bacteria KLEBSIELLA PNEUMONIAE  Final      Susceptibility   Klebsiella pneumoniae - MIC*    AMPICILLIN 16 RESISTANT Resistant     CEFTAZIDIME <=1 SENSITIVE Sensitive     CEFAZOLIN <=4 SENSITIVE Sensitive     CEFTRIAXONE <=1 SENSITIVE Sensitive     CIPROFLOXACIN <=0.25 SENSITIVE Sensitive     GENTAMICIN <=1 SENSITIVE Sensitive     IMIPENEM <=0.25 SENSITIVE Sensitive     TRIMETH/SULFA <=20 SENSITIVE Sensitive     CEFOXITIN <=4 SENSITIVE Sensitive     PIP/TAZO Value in next row Sensitive      SENSITIVE<=4    LEVOFLOXACIN Value in next row Sensitive      SENSITIVE<=0.12    * KLEBSIELLA PNEUMONIAE    ASSESSMENT/PLAN: Klebsiella PNA bacteremia - unclear source since UA with no wbc, UCX neg and CXR negative  changed to bactrim DS for total 14 day course total Monitor overnight to ensure no recurrent fevers or worsening with change to oral bx from IV  ARF cr improved to 0.91 from 1.28 with IVF  Dispo  - PT rec hh PT Pt is DNR.  He was on hospice but family now requests SNF - wil have to be dcd from hospice Social work help appreciated- FL2 signed

## 2015-04-14 NOTE — Clinical Social Work Note (Signed)
CSW spoke to pt, pt's wife (in the room) and pt's daughter Jeniffer via phone. Current DC plan is to DC to [facility].  Hospice benefit will be discontinued tomorrow and after this paperwork is completed pt can DC to [facility].  Facility made aware of this.  Spoke to [employer] at [facility]  Lorriane Shire at [place] is also aware of this plan.  Her contact info is [phone] or office # [phone].

## 2015-04-14 NOTE — Care Management Note (Signed)
Case Management Note  Patient Details  Name: CONAN MCMANAWAY MRN: [id number] Date of Birth: Aug 19, 1926  Subjective/Objective:        [place] Social Work is working with Mr Moffatt and his wife regarding possible placement which would mean discharge from [facility]osp[facility] services [phone]).             Action/Plan:   Expected Discharge Date:  04/11/15               Expected Discharge Plan:     In-House Referral:     Discharge planning Services     Post Acute Care Choice:    Choice offered to:     DME Arranged:    DME Agency:     HH Arranged:    [place] Agency:     Status of Service:     Medicare Important Message Given:  Yes-second notification given Date Medicare IM Given:    Medicare IM give by:    Date Additional Medicare IM Given:    Additional Medicare Important Message give by:     If discussed at [place] of Stay Meetings, dates discussed:    Additional Comments:  Lisandro Meggett A, RN 04/14/2015, 1:15 PM

## 2015-04-14 NOTE — Progress Notes (Signed)
Report called to [place], [place]. Will gather patient's things and call orderly to transport.

## 2015-04-15 DIAGNOSIS — L8990 Pressure ulcer of unspecified site, unspecified stage: Secondary | ICD-10-CM | POA: Insufficient documentation

## 2015-04-15 LAB — GLUCOSE, CAPILLARY
Glucose-Capillary: 105 mg/dL — ABNORMAL HIGH (ref 65–99)
Glucose-Capillary: 125 mg/dL — ABNORMAL HIGH (ref 65–99)

## 2015-04-15 MED ORDER — MAGNESIUM HYDROXIDE 400 MG/5ML PO SUSP
15.0000 mL | Freq: Every day | ORAL | Status: DC
  Administered 2015-04-15: 15 mL via ORAL
  Filled 2015-04-15: qty 30

## 2015-04-15 MED ORDER — BISACODYL 10 MG RE SUPP
10.0000 mg | Freq: Once | RECTAL | Status: AC
  Administered 2015-04-15: 10 mg via RECTAL
  Filled 2015-04-15: qty 1

## 2015-04-15 NOTE — Evaluation (Signed)
Clinical/Bedside Swallow Evaluation Patient Details  Name: [name] MRN: [id number] Date of Birth: 1926/09/29  Today's Date: 04/15/2015 Time: SLP Start Time (ACUTE ONLY): 9 SLP Stop Time (ACUTE ONLY): 1445 SLP Time Calculation (min) (ACUTE ONLY): 43 min  Past Medical History:  Past Medical History  Diagnosis Date  . CAD (coronary artery disease)   . Diabetes mellitus, type 2   . GERD (gastroesophageal reflux disease)   . Hyperlipidemia   . Anxiety   . Anemia     iron deficient a. ? h/o GI bleed  . Nephrolithiasis   . Prostate cancer   . Nephrolithiasis   . CHF (congestive heart failure)   . Hypertension    Past Surgical History:  Past Surgical History  Procedure Laterality Date  . Cardiac bypass surgery  2002    five vessel  . Radiation seed implants for prostate cancer    . H/o esoph dilatation      x 2 in past  . Left thr  2004  . Hernia repair  1994  . Total hip arthroplasty  2004   HPI:  pt has a h/o GERD w/ Esophageal stricture and dilitations(3) per wife. Pt had difficulty eating the roast beef sandwich at lunch today per wife. She stated she fixes foods for him at home but "cooks them soft". She stated she has also seen pt "hold the food in his mouth" and has encouraged him to swallow it.   Assessment / Plan / Recommendation Clinical Impression  Pt presents w/ min. oral phase deficits c/b oral holding of both foods and liquids. Given min. amount of time and verbal encouragement when [place] pt transfered, swallowed, and cleared each bolus. No oral residue remained. No pharyngeal phase deficits noted - no coughing or throat clearing occured w/ trials; vocal quality was clear b/t trials. Pt did not want anything further but was able to determine from wife that she cooks all of his foods so that they are soft and able to be mashed. Pt appears at reduced risk for aspiration w/ a Dys. 2 diet w/ thin liquids; meds in Puree as pt had difficulty coordinating swallowing  pills w/ water w/ NSG today(suspect related to the oral phase deficits of oral holding and possibly Cognitive decline?). Rec. general aspiration precautions. Rec. give trials of increased textured foods at SNF as appropriate. This Dys. 2 diet woudl be more broken down for easier Esophageal clearing as pt does not wear his partial and is missing a couple of teeth. Rec. moistening foods well.     Aspiration Risk   (reduced w/ precautions)    Diet Recommendation Dysphagia 2 (Fine chop);Thin   Medication Administration: Whole meds with puree Compensations: Slow rate;Small sips/bites (time b/t bites/sips)    Other  Recommendations Recommended Consults: Consider GI evaluation (Dietician consult for supplements - drink form) Oral Care Recommendations: Oral care BID;Oral care before and after PO;Patient independent with oral care (TBD)   Follow Up Recommendations       Frequency and Duration min 2x/week  1 week   Pertinent Vitals/Pain denied    SLP Swallow Goals  see care plan    Swallow Study Prior Functional Status       General Date of Onset: 04/15/15 Other Pertinent Information: pt has a h/o GERD w/ Esophageal stricture and dilitations(3) per wife. Pt had difficulty eating the roast beef sandwich at lunch today per wife. She stated she fixes foods for him at home but "cooks them soft". She stated  she has also seen pt "hold the food in his mouth" and has encouraged him to swallow it. Type of Study: Bedside swallow evaluation Previous Swallow Assessment: none indicated Diet Prior to this Study: Regular;Thin liquids Temperature Spikes Noted: No Respiratory Status: Room air History of Recent Intubation: No Behavior/Cognition: Alert;Cooperative;Requires cueing Oral Cavity - Dentition: Adequate natural dentition/normal for age (missing a few) Self-Feeding Abilities: Able to feed self;Needs set up Patient Positioning: Upright in bed Baseline Vocal Quality: Normal Volitional Cough:  Strong Volitional Swallow: Able to elicit    Oral/Motor/Sensory Function Overall Oral Motor/Sensory Function: Appears within functional limits for tasks assessed Labial ROM: Within Functional Limits Labial Symmetry: Within Functional Limits Labial Strength: Within Functional Limits Lingual ROM: Within Functional Limits Lingual Symmetry: Within Functional Limits Lingual Strength: Within Functional Limits Facial Symmetry: Within Functional Limits Velum: Within Functional Limits Mandible: Within Functional Limits   Ice Chips Ice chips: Within functional limits Presentation: Spoon (fed by SLP; x1(does not like the cold))   Thin Liquid Thin Liquid: Impaired Presentation: Cup;Self Fed;Straw (8 trials total) Oral Phase Impairments:  (oral holding - min. ) Oral Phase Functional Implications: Oral holding (min. ) Pharyngeal  Phase Impairments:  (none)    Nectar Thick Nectar Thick Liquid: Not tested   Honey Thick Honey Thick Liquid: Not tested   Puree Puree: Impaired Presentation: Self Fed;Spoon (fed by SLP; x6 trials) Oral Phase Impairments:  (oral holding - min. ) Oral Phase Functional Implications: Oral holding (min. ) Pharyngeal Phase Impairments:  (none)   Solid   GO    Solid: Not tested (did not want anything)       Watson,Katherine 04/15/2015,3:30 PM

## 2015-04-15 NOTE — Clinical Social Work Placement (Signed)
   CLINICAL SOCIAL WORK PLACEMENT  NOTE  Date:  04/15/2015  Patient Details  Name: [name] MRN: [id number] Date of Birth: [birth year]  Clinical Social Work is seeking post-discharge placement for this patient at the [facility] level of care (*CSW will initial, date and re-position this form in  chart as items are completed):  Yes   Patient/family provided with [place] Work Department's list of facilities offering this level of care within the geographic area requested by the patient (or if unable, by the patient's family).  Yes   Patient/family informed of their freedom to choose among providers that offer the needed level of care, that participate in Medicare, Medicaid or managed care program needed by the patient, have an available bed and are willing to accept the patient.  Yes   Patient/family informed of [place]'s ownership interest in [facility] and [facility], as well as of the fact that they are under no obligation to receive care at these facilities.  PASRR submitted to EDS on 04/13/15     PASRR number received on 04/13/15     Existing PASRR number confirmed on       FL2 transmitted to all facilities in geographic area requested by pt/family on 04/13/15     FL2 transmitted to all facilities within larger geographic area on       Patient informed that his/her managed care company has contracts with or will negotiate with certain facilities, including the following:            Patient/family informed of bed offers received.  Patient chooses bed at  [facility])     Physician recommends and patient chooses bed at  [facility])    Patient to be transferred to  [facility]) on 04/15/15.  Patient to be transferred to facility by  [facility])     Patient family notified on 04/15/15 of transfer.  Name of family member notified:   (Pt's wife, Deloris)     PHYSICIAN       Additional Comment:     _______________________________________________ Darden Dates, LCSW 04/15/2015, 4:11 PM

## 2015-04-15 NOTE — Progress Notes (Signed)
Alert and oriented . VSS. No signs of acute distress. Order received to dc patient to [facility]. Report given to [facility] the nurse. No other issues observed.

## 2015-04-15 NOTE — Care Management (Signed)
IM given °

## 2015-04-15 NOTE — Progress Notes (Signed)
[name] is a 81 y.o. male  <principal problem not specified>   SUBJECTIVE:  Pt stable without complaints. Afebrile. WBC normal. Tachycardia resolved. Gram negative rods in blood/urine. On IV Rocephin.  7/2 - feels good - eating some, needs help to get oob. No fevers chills.  7/3  Changed to po bactrim. No fevers, urinating with very mild dysuria 7/4 - no temp >24 hours. No BM, some diff swallowing  ______________________________________________________________________  ROS: Review of systems is unremarkable for any active cardiac,respiratory, GI, GU, hematologic, neurologic or psychiatric systems, 10 systems reviewed.  Scheduled Meds: . allopurinol  100 mg Oral Daily  . aspirin EC  81 mg Oral Daily  . carvedilol  12.5 mg Oral BID  . docusate sodium  100 mg Oral BID  . insulin aspart  0-15 Units Subcutaneous TID WC  . insulin aspart  0-5 Units Subcutaneous QHS  . pantoprazole  40 mg Oral Daily  . sodium chloride  3 mL Intravenous Q12H  . sulfamethoxazole-trimethoprim  1 tablet Oral Q12H   Continuous Infusions:  PRN Meds:.acetaminophen **OR** acetaminophen, albuterol, ondansetron **OR** ondansetron (ZOFRAN) IV   Past Medical History  Diagnosis Date  . CAD (coronary artery disease)   . Diabetes mellitus, type 2   . GERD (gastroesophageal reflux disease)   . Hyperlipidemia   . Anxiety   . Anemia     iron deficient a. ? h/o GI bleed  . Nephrolithiasis   . Prostate cancer   . Nephrolithiasis   . CHF (congestive heart failure)   . Hypertension     Past Surgical History  Procedure Laterality Date  . Cardiac bypass surgery  2002    five vessel  . Radiation seed implants for prostate cancer    . H/o esoph dilatation      x 2 in past  . Left thr  2004  . Hernia repair  1994  . Total hip arthroplasty  2004    PHYSICAL EXAM:  BP 113/54 mmHg  Pulse 64  Temp(Src) 97.8 F (36.6 C) (Oral)  Resp 20  Ht 5\' 11"  (1.803 m)  Wt 61.009 kg (134 lb 8 oz)  BMI 18.77  kg/m2  SpO2 97%  Wt Readings from Last 3 Encounters:  04/12/15 61.009 kg (134 lb 8 oz)  07/05/14 65.318 kg (144 lb)  11/06/13 65.545 kg (144 lb 8 oz)            Constitutional: NAD frail, hunched over in bed Neck: supple, no thyromegaly Respiratory: CTA, no rales or wheezes Cardiovascular: RRR, 2/6 systolic murmur, no gallop Abdomen: soft, good BS, nontender Extremities: no edema Neuro: alert and oriented, no focal motor or sensory deficits  Lab Results  Component Value Date   WBC 7.8 04/13/2015   HGB 12.4* 04/13/2015   HCT 37.1* 04/13/2015   MCV 89.5 04/13/2015   PLT 175 04/13/2015    Lab Results  Component Value Date   CREATININE 0.91 04/13/2015   BUN 27* 04/13/2015   NA 139 04/13/2015   K 4.0 04/13/2015   CL 110 04/13/2015   CO2 25 04/13/2015   Results for orders placed or performed during the hospital encounter of 04/10/15  Culture, blood (routine x 2)     Status: None   Collection Time: 04/10/15  1:42 PM  Result Value Ref Range Status   Specimen Description BLOOD  Final   Special Requests NONE  Final   Culture  Setup Time   Final    GRAM NEGATIVE RODS IN BOTH  AEROBIC AND ANAEROBIC BOTTLES CRITICAL RESULT CALLED TO, READ BACK BY AND VERIFIED WITH: DELL HOPKINS AT [id number] 04/11/15 DV    Culture   Final    KLEBSIELLA PNEUMONIAE IN BOTH AEROBIC AND ANAEROBIC BOTTLES    Report Status 04/14/2015 FINAL  Final   Organism ID, Bacteria KLEBSIELLA PNEUMONIAE  Final      Susceptibility   Klebsiella pneumoniae - MIC*    AMPICILLIN 16 RESISTANT Resistant     CEFTAZIDIME <=1 SENSITIVE Sensitive     CEFAZOLIN <=4 SENSITIVE Sensitive     CEFTRIAXONE <=1 SENSITIVE Sensitive     CIPROFLOXACIN <=0.25 SENSITIVE Sensitive     GENTAMICIN <=1 SENSITIVE Sensitive     IMIPENEM <=0.25 SENSITIVE Sensitive     TRIMETH/SULFA <=20 SENSITIVE Sensitive     CEFOXITIN <=4 SENSITIVE Sensitive     PIP/TAZO Value in next row Sensitive      SENSITIVE<=4    LEVOFLOXACIN Value in next row  Sensitive      SENSITIVE<=0.12    * KLEBSIELLA PNEUMONIAE  Urine culture     Status: None   Collection Time: 04/10/15  1:42 PM  Result Value Ref Range Status   Specimen Description URINE, RANDOM  Final   Special Requests NONE  Final   Culture NO GROWTH 2 DAYS  Final   Report Status 04/12/2015 FINAL  Final  Culture, blood (routine x 2)     Status: None   Collection Time: 04/10/15  1:43 PM  Result Value Ref Range Status   Specimen Description BLOOD  Final   Special Requests NONE  Final   Culture  Setup Time   Final    GRAM NEGATIVE RODS IN BOTH AEROBIC AND ANAEROBIC BOTTLES CRITICAL VALUE NOTED.  VALUE IS CONSISTENT WITH PREVIOUSLY REPORTED AND CALLED VALUE.    Culture KLEBSIELLA PNEUMONIAE  Final   Report Status 04/13/2015 FINAL  Final   Organism ID, Bacteria KLEBSIELLA PNEUMONIAE  Final      Susceptibility   Klebsiella pneumoniae - MIC*    AMPICILLIN 16 RESISTANT Resistant     CEFTAZIDIME <=1 SENSITIVE Sensitive     CEFAZOLIN <=4 SENSITIVE Sensitive     CEFTRIAXONE <=1 SENSITIVE Sensitive     CIPROFLOXACIN <=0.25 SENSITIVE Sensitive     GENTAMICIN <=1 SENSITIVE Sensitive     IMIPENEM <=0.25 SENSITIVE Sensitive     TRIMETH/SULFA <=20 SENSITIVE Sensitive     CEFOXITIN <=4 SENSITIVE Sensitive     PIP/TAZO Value in next row Sensitive      SENSITIVE<=4    LEVOFLOXACIN Value in next row Sensitive      SENSITIVE<=0.12    * KLEBSIELLA PNEUMONIAE    ASSESSMENT/PLAN: Klebsiella PNA bacteremia - unclear source since UA with no wbc, UCX neg and CXR negative  changed to bactrim DS for total 14 day course total  ARF cr improved to 0.91 from 1.28 with IVF  Dispo  - PT rec hh PT Pt is DNR.  He was on hospice but family now requests SNF - wil have to be dcd from hospice Social work help appreciated- FL2 signed

## 2015-04-15 NOTE — Clinical Social Work Note (Signed)
Pt is ready for discharge today. Pt will admit to [facility]. Pt's wife has recoated Hospice. Pt will go to SNF for PT. Pt and family are agreeable to discharge plan. Facility has received discharge information and is ready to admit pt. RN to call report and EMS will provide transportation. CSW is signing off as no further needs identified.   Darden Dates, MSW, LCSW Clinical Social Worker  [phone]

## 2015-04-15 NOTE — Progress Notes (Signed)
Small formed balls

## 2015-04-15 NOTE — Progress Notes (Signed)
Physical Therapy Treatment Patient Details Name: [name] MRN: [id number] DOB: 1926-07-12 Today's Date: 04/15/2015    History of Present Illness Pt is a 81 year old male admitted to the hospital with fever and altered mental status.     PT Comments    Patient is still very slow with initiating movement and functional tasks. He demonstrates fair sitting balance and poor standing balance with posterior lean and decreased postural control. Patient had periods of time where he stopped during gait task and required cues to reinitiate gait and direct towards chair. Patient impulsive and starts sitting prior to getting close enough to chair due to fatigue. Patient's family is fearful he will fall if left alone. Patient would benefit from additional skilled PT intervention to improve balance/gait safety and LE strength. PT recommends SNF upon discharge for additional rehab in supervised setting to improve balance/gait safety.   Follow Up Recommendations  SNF     Equipment Recommendations  None recommended by PT    Recommendations for Other Services       Precautions / Restrictions Precautions Precautions: Fall Restrictions Weight Bearing Restrictions: No    Mobility  Bed Mobility Overal bed mobility: Needs Assistance Bed Mobility: Supine to Sit     Supine to sit: Min assist     General bed mobility comments: pt transitioned with elevated head of bed; required mod Vcs for hand placement including to pull self up on bed rails; Required min A for scooting forward when sitting edge of bed;   Transfers Overall transfer level: Needs assistance Equipment used: Rolling walker (2 wheeled) Transfers: Sit to/from Stand Sit to Stand: Mod assist;Max assist         General transfer comment: Patient transfers from bed, sit to stand with mod A with RW requiring mod Vcs for hand placement and to increase push through BLE for better transfer ability; once close to the chair, patient  requires max A stand to sit as he started falling backwards posteriorly and required assistance to slow descent. Patient required max VCs to reach back for chair prior to sitting but failed to reach back despite instruction;   Ambulation/Gait Ambulation/Gait assistance: Mod assist Ambulation Distance (Feet): 15 Feet Assistive device: Rolling walker (2 wheeled) Gait Pattern/deviations: Step-through pattern;Decreased step length - right;Decreased step length - left;Wide base of support Gait velocity: Slow   General Gait Details: pt ambulates very slowly with forward flexed posture and slight lean to left side; Patient required mod VCs to increase step length and increase DF at heel strike for better foot clearance; Patient exhibits periods of time of standing with RW requiring cues to re-initiate gait tasks;    Stairs            Wheelchair Mobility    Modified Rankin (Stroke Patients Only)       Balance Overall balance assessment: Needs assistance Sitting-balance support: Feet supported;Bilateral upper extremity supported Sitting balance-Leahy Scale: Fair Sitting balance - Comments: able to sit on edge of bed without assistance once feet and hands are supported   Standing balance support: Bilateral upper extremity supported Standing balance-Leahy Scale: Poor Standing balance comment: demonstrates posterior lean and forward flexed posture requiring mod-max A for standing balance;                     Cognition Arousal/Alertness: Awake/alert Behavior During Therapy: WFL for tasks assessed/performed Overall Cognitive Status: Difficult to assess (still slower response )  Exercises      General Comments        Pertinent Vitals/Pain Pain Assessment: No/denies pain    Home Living                      Prior Function            PT Goals (current goals can now be found in the care plan section) Acute Rehab PT Goals Patient  Stated Goal: To go home PT Goal Formulation: With patient Time For Goal Achievement: 04/25/15 Potential to Achieve Goals: Good Progress towards PT goals: Progressing toward goals    Frequency  Min 2X/week    PT Plan Discharge plan needs to be updated    Co-evaluation             End of Session Equipment Utilized During Treatment: Gait belt Activity Tolerance: Patient limited by fatigue Patient left: in chair;with call bell/phone within reach;with chair alarm set     Time: 1140-1158 PT Time Calculation (min) (ACUTE ONLY): 18 min  Charges:  $Gait Training: 8-22 mins                    G Codes:      Hopkins,Ameliya Nicotra 04-20-15, 1:15 PM

## 2015-04-18 ENCOUNTER — Non-Acute Institutional Stay (SKILLED_NURSING_FACILITY): Payer: Medicare Other | Admitting: Internal Medicine

## 2015-04-18 DIAGNOSIS — R5381 Other malaise: Secondary | ICD-10-CM | POA: Diagnosis not present

## 2015-04-18 DIAGNOSIS — L89152 Pressure ulcer of sacral region, stage 2: Secondary | ICD-10-CM

## 2015-04-18 DIAGNOSIS — K219 Gastro-esophageal reflux disease without esophagitis: Secondary | ICD-10-CM | POA: Diagnosis not present

## 2015-04-18 DIAGNOSIS — M1A00X0 Idiopathic chronic gout, unspecified site, without tophus (tophi): Secondary | ICD-10-CM

## 2015-04-18 DIAGNOSIS — R7881 Bacteremia: Secondary | ICD-10-CM | POA: Diagnosis not present

## 2015-04-18 DIAGNOSIS — B961 Klebsiella pneumoniae [K. pneumoniae] as the cause of diseases classified elsewhere: Secondary | ICD-10-CM | POA: Diagnosis not present

## 2015-04-18 DIAGNOSIS — I10 Essential (primary) hypertension: Secondary | ICD-10-CM | POA: Diagnosis not present

## 2015-04-18 DIAGNOSIS — L98429 Non-pressure chronic ulcer of back with unspecified severity: Secondary | ICD-10-CM

## 2015-04-18 DIAGNOSIS — R1310 Dysphagia, unspecified: Secondary | ICD-10-CM | POA: Diagnosis not present

## 2015-04-19 NOTE — Progress Notes (Signed)
Patient ID: [name], male   DOB: Nov 04, 1925, 81 y.o.   MRN: [id number]     Facility: [facility]    PCP: Idelle Crouch, MD  Code Status: full code  Allergies  Allergen Reactions  . Iodinated Diagnostic Agents Other (See Comments)    Reaction:  Unknown   . Penicillins Other (See Comments)    Reaction:  Unknown    Chief Complaint  Patient presents with  . New Admit To SNF     HPI:  81 y.o. patient is here for short term rehabilitation post hospital admission from 04/10/15-04/15/15 with sepsis in setting of klebsiella pneumoniae bacteremia. He responded well to antibiotics. He had acute renal failure and was noted to have stage 2 pressure ulcer. He is seen in his room today. He feels weak and tired. His appetite is poor/. He denies any other concerns. He has pmh of HTN., CAD, HLD.   Review of Systems:  Constitutional: Negative for fever, chills, diaphoresis.  HENT: Negative for headache, congestion, nasal discharge Eyes: Negative for eye pain, blurred vision, double vision and discharge.  Respiratory: Negative for cough, shortness of breath and wheezing.   Cardiovascular: Negative for chest pain, palpitations, leg swelling.  Gastrointestinal: Negative for heartburn, nausea, vomiting, abdominal pain. Had bowel movement 2 days back Genitourinary: Negative for dysuria Skin: Negative for itching, rash.  Neurological: Negative for dizziness, tingling, focal weakness Psychiatric/Behavioral: Negative for depression and insomnia   Past Medical History  Diagnosis Date  . CAD (coronary artery disease)   . Diabetes mellitus, type 2   . GERD (gastroesophageal reflux disease)   . Hyperlipidemia   . Anxiety   . Anemia     iron deficient a. ? h/o GI bleed  . Nephrolithiasis   . Prostate cancer   . Nephrolithiasis   . CHF (congestive heart failure)   . Hypertension    Past Surgical History  Procedure Laterality Date  . Cardiac bypass surgery  2002     five vessel  . Radiation seed implants for prostate cancer    . H/o esoph dilatation      x 2 in past  . Left thr  2004  . Hernia repair  1994  . Total hip arthroplasty  2004   Social History:   reports that he has never smoked. He does not have any smokeless tobacco history on file. He reports that he does not drink alcohol or use illicit drugs.  Family History  Problem Relation Age of Onset  . Heart disease Brother     Medications: Patient's Medications  New Prescriptions   No medications on file  Previous Medications   ALLOPURINOL (ZYLOPRIM) 100 MG TABLET    Take 100 mg by mouth daily.   AMLODIPINE (NORVASC) 5 MG TABLET    Take 5 mg by mouth daily.    CARVEDILOL (COREG) 12.5 MG TABLET    Take 12.5 mg by mouth 2 (two) times daily.   OMEPRAZOLE (PRILOSEC) 20 MG CAPSULE    Take 20 mg by mouth daily.   SULFAMETHOXAZOLE-TRIMETHOPRIM (BACTRIM DS,SEPTRA DS) 800-160 MG PER TABLET    Take 1 tablet by mouth every 12 (twelve) hours.  Modified Medications   No medications on file  Discontinued Medications   No medications on file     Physical Exam: Filed Vitals:   04/18/15 1547  BP: 150/89  Pulse: 88  Temp: 98.1 F (36.7 C)  Resp: 18  SpO2: 96%    General- elderly male,  frail, in no acute distress Head- normocephalic, atraumatic Throat- moist mucus membrane Cardiovascular- normal s1,s2, no murmurs, palpable dorsalis pedis, no leg edema Respiratory- bilateral clear to auscultation, no wheeze, no rhonchi, no crackles, no use of accessory muscles Abdomen- bowel sounds present, soft, non tender Musculoskeletal- able to move all 4 extremities, generalized weakness Neurological- no focal deficit, alert and oriented  Skin- warm and dry, stage 2 coccyx ulcer Psychiatry- normal mood and affect    Labs reviewed: Basic Metabolic Panel:  Recent Labs  04/10/15 1342 04/12/15 0517 04/13/15 0436  NA 138 139 139  K 4.9 3.9 4.0  CL 106 110 110  CO2 20* 22 25  GLUCOSE  228* 106* 121*  BUN 33* 30* 27*  CREATININE 1.28* 0.94 0.91  CALCIUM 9.7 8.7* 8.7*   Liver Function Tests:  Recent Labs  11/13/14 0223 04/10/15 1342 04/12/15 0517  AST 44* 52* 50*  ALT 47 45 34  ALKPHOS 151* 186* 176*  BILITOT  --  2.4* 0.7  PROT 7.7 6.9 5.4*  ALBUMIN 3.3* 2.9* 2.2*   No results for input(s): LIPASE, AMYLASE in the last 8760 hours. No results for input(s): AMMONIA in the last 8760 hours. CBC:  Recent Labs  04/10/15 1342 04/12/15 0517 04/13/15 0436  WBC 7.8 9.7 7.8  NEUTROABS 7.5* 7.8* 5.9  HGB 14.4 11.7* 12.4*  HCT 43.5 34.6* 37.1*  MCV 90.1 89.3 89.5  PLT 195 153 175    Radiological Exams: Dg Chest Port 1 View  04/10/2015   CLINICAL DATA:  Fever, altered mental status  EXAM: PORTABLE CHEST - 1 VIEW  COMPARISON:  11/13/2014  FINDINGS: There is no focal parenchymal opacity. There is no pleural effusion or pneumothorax. The heart and mediastinal contours are unremarkable. 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  IMPRESSION: No active disease.   Electronically Signed   By: Kathreen Devoid   On: 04/10/2015 14:14    Assessment/Plan  Physical deconditioning Will have him work with physical therapy and occupational therapy team to help with gait training and muscle strengthening exercises.fall precautions. Skin care. Encourage to be out of bed.   Klebsiella bacteremia Afebrile, monitor wbc curve, continue and complete bactrim ds bid on 04/25/15.   Sacral ulcer stage 2 Continue skin care with pressure ulcer prophylaxis. Continue cleaning with NS, hydrogel and collagen with dry dressing q 3 days, to be followed by wound care  Dysphagia Continue mechanical soft diet with aspiration precautions  HTN bp elevated. Continue amlodipine 5 mg daily and coreg 12.5 bid and monitor bp q shift for now  gerd Stable symptoms, continue prilosec 20 mg daily and monitor  Gout No recent flare up, continue  allopurinol 100 mg daily   Goals of care: short term rehabilitation   Labs/tests ordered: cbc, bmp  Family/ staff Communication: reviewed care plan with patient and nursing supervisor    Blanchie Serve, MD  [facility] [phone] (Monday-Friday 8 am - 5 pm) [phone] (afterhours)

## 2015-04-22 ENCOUNTER — Inpatient Hospital Stay (HOSPITAL_COMMUNITY)
Admission: EM | Admit: 2015-04-22 | Discharge: 2015-04-25 | DRG: 065 | Disposition: A | Discharge status: Skilled Nursing Facility | Payer: Medicare Other | Attending: Internal Medicine | Admitting: Internal Medicine

## 2015-04-22 ENCOUNTER — Encounter (HOSPITAL_COMMUNITY): Payer: Self-pay | Admitting: Emergency Medicine

## 2015-04-22 ENCOUNTER — Emergency Department (HOSPITAL_COMMUNITY): Payer: Medicare Other

## 2015-04-22 ENCOUNTER — Inpatient Hospital Stay (HOSPITAL_COMMUNITY): Payer: Medicare Other

## 2015-04-22 DIAGNOSIS — I63411 Cerebral infarction due to embolism of right middle cerebral artery: Secondary | ICD-10-CM | POA: Diagnosis present

## 2015-04-22 DIAGNOSIS — Z880 Allergy status to penicillin: Secondary | ICD-10-CM | POA: Diagnosis not present

## 2015-04-22 DIAGNOSIS — Z8249 Family history of ischemic heart disease and other diseases of the circulatory system: Secondary | ICD-10-CM

## 2015-04-22 DIAGNOSIS — Z91041 Radiographic dye allergy status: Secondary | ICD-10-CM

## 2015-04-22 DIAGNOSIS — I350 Nonrheumatic aortic (valve) stenosis: Secondary | ICD-10-CM | POA: Diagnosis present

## 2015-04-22 DIAGNOSIS — I2510 Atherosclerotic heart disease of native coronary artery without angina pectoris: Secondary | ICD-10-CM | POA: Diagnosis present

## 2015-04-22 DIAGNOSIS — I639 Cerebral infarction, unspecified: Secondary | ICD-10-CM | POA: Diagnosis present

## 2015-04-22 DIAGNOSIS — Z951 Presence of aortocoronary bypass graft: Secondary | ICD-10-CM | POA: Diagnosis not present

## 2015-04-22 DIAGNOSIS — R112 Nausea with vomiting, unspecified: Secondary | ICD-10-CM | POA: Diagnosis present

## 2015-04-22 DIAGNOSIS — R1310 Dysphagia, unspecified: Secondary | ICD-10-CM | POA: Diagnosis present

## 2015-04-22 DIAGNOSIS — K219 Gastro-esophageal reflux disease without esophagitis: Secondary | ICD-10-CM | POA: Diagnosis present

## 2015-04-22 DIAGNOSIS — E119 Type 2 diabetes mellitus without complications: Secondary | ICD-10-CM | POA: Diagnosis present

## 2015-04-22 DIAGNOSIS — E875 Hyperkalemia: Secondary | ICD-10-CM | POA: Diagnosis present

## 2015-04-22 DIAGNOSIS — Z96649 Presence of unspecified artificial hip joint: Secondary | ICD-10-CM | POA: Diagnosis present

## 2015-04-22 DIAGNOSIS — R7881 Bacteremia: Secondary | ICD-10-CM | POA: Diagnosis present

## 2015-04-22 DIAGNOSIS — G8194 Hemiplegia, unspecified affecting left nondominant side: Secondary | ICD-10-CM | POA: Diagnosis present

## 2015-04-22 DIAGNOSIS — I4891 Unspecified atrial fibrillation: Secondary | ICD-10-CM | POA: Diagnosis present

## 2015-04-22 DIAGNOSIS — E785 Hyperlipidemia, unspecified: Secondary | ICD-10-CM | POA: Diagnosis present

## 2015-04-22 DIAGNOSIS — I6789 Other cerebrovascular disease: Secondary | ICD-10-CM | POA: Diagnosis not present

## 2015-04-22 DIAGNOSIS — I10 Essential (primary) hypertension: Secondary | ICD-10-CM

## 2015-04-22 DIAGNOSIS — Z79899 Other long term (current) drug therapy: Secondary | ICD-10-CM | POA: Diagnosis not present

## 2015-04-22 DIAGNOSIS — Z66 Do not resuscitate: Secondary | ICD-10-CM | POA: Diagnosis present

## 2015-04-22 DIAGNOSIS — B961 Klebsiella pneumoniae [K. pneumoniae] as the cause of diseases classified elsewhere: Secondary | ICD-10-CM | POA: Diagnosis present

## 2015-04-22 DIAGNOSIS — I63131 Cerebral infarction due to embolism of right carotid artery: Secondary | ICD-10-CM

## 2015-04-22 DIAGNOSIS — Z8546 Personal history of malignant neoplasm of prostate: Secondary | ICD-10-CM

## 2015-04-22 DIAGNOSIS — F419 Anxiety disorder, unspecified: Secondary | ICD-10-CM | POA: Diagnosis present

## 2015-04-22 LAB — DIFFERENTIAL
Basophils Absolute: 0 10*3/uL (ref 0.0–0.1)
Basophils Relative: 0 % (ref 0–1)
Eosinophils Absolute: 0.2 10*3/uL (ref 0.0–0.7)
Eosinophils Relative: 2 % (ref 0–5)
Lymphocytes Relative: 19 % (ref 12–46)
Lymphs Abs: 1.8 10*3/uL (ref 0.7–4.0)
Monocytes Absolute: 0.9 10*3/uL (ref 0.1–1.0)
Monocytes Relative: 9 % (ref 3–12)
NEUTROS PCT: 70 % (ref 43–77)
Neutro Abs: 6.6 10*3/uL (ref 1.7–7.7)

## 2015-04-22 LAB — COMPREHENSIVE METABOLIC PANEL
ALK PHOS: 181 U/L — AB (ref 38–126)
ALT: 27 U/L (ref 17–63)
AST: 42 U/L — ABNORMAL HIGH (ref 15–41)
Albumin: 2.5 g/dL — ABNORMAL LOW (ref 3.5–5.0)
Anion gap: 6 (ref 5–15)
BILIRUBIN TOTAL: 0.4 mg/dL (ref 0.3–1.2)
BUN: 19 mg/dL (ref 6–20)
CALCIUM: 8.9 mg/dL (ref 8.9–10.3)
CO2: 22 mmol/L (ref 22–32)
CREATININE: 1.35 mg/dL — AB (ref 0.61–1.24)
Chloride: 104 mmol/L (ref 101–111)
GFR calc Af Amer: 53 mL/min — ABNORMAL LOW (ref >60)
GFR calc non Af Amer: 46 mL/min — ABNORMAL LOW (ref >60)
Glucose, Bld: 165 mg/dL — ABNORMAL HIGH (ref 65–99)
POTASSIUM: 5.8 mmol/L — AB (ref 3.5–5.1)
Sodium: 132 mmol/L — ABNORMAL LOW (ref 135–145)
Total Protein: 5.9 g/dL — ABNORMAL LOW (ref 6.5–8.1)

## 2015-04-22 LAB — APTT: aPTT: 32 seconds (ref 24–37)

## 2015-04-22 LAB — CBC
HCT: 36.9 % — ABNORMAL LOW (ref 39.0–52.0)
Hemoglobin: 12.4 g/dL — ABNORMAL LOW (ref 13.0–17.0)
MCH: 29.5 pg (ref 26.0–34.0)
MCHC: 33.6 g/dL (ref 30.0–36.0)
MCV: 87.6 fL (ref 78.0–100.0)
PLATELETS: 353 10*3/uL (ref 150–400)
RBC: 4.21 MIL/uL — ABNORMAL LOW (ref 4.22–5.81)
RDW: 16.2 % — ABNORMAL HIGH (ref 11.5–15.5)
WBC: 9.5 10*3/uL (ref 4.0–10.5)

## 2015-04-22 LAB — PROTIME-INR
INR: 1.12 (ref 0.00–1.49)
Prothrombin Time: 14.6 seconds (ref 11.6–15.2)

## 2015-04-22 LAB — I-STAT CHEM 8, ED
BUN: 23 mg/dL — ABNORMAL HIGH (ref 6–20)
CALCIUM ION: 1.26 mmol/L (ref 1.13–1.30)
CHLORIDE: 103 mmol/L (ref 101–111)
Creatinine, Ser: 1.3 mg/dL — ABNORMAL HIGH (ref 0.61–1.24)
GLUCOSE: 165 mg/dL — AB (ref 65–99)
HEMATOCRIT: 41 % (ref 39.0–52.0)
Hemoglobin: 13.9 g/dL (ref 13.0–17.0)
Potassium: 5.7 mmol/L — ABNORMAL HIGH (ref 3.5–5.1)
Sodium: 134 mmol/L — ABNORMAL LOW (ref 135–145)
TCO2: 20 mmol/L (ref 0–100)

## 2015-04-22 LAB — I-STAT TROPONIN, ED: Troponin i, poc: 0.05 ng/mL (ref 0.00–0.08)

## 2015-04-22 LAB — ETHANOL: Alcohol, Ethyl (B): <5 mg/dL (ref <5)

## 2015-04-22 MED ORDER — SODIUM CHLORIDE 0.9 % IV SOLN
INTRAVENOUS | Status: DC
  Administered 2015-04-22 – 2015-04-24 (×2): via INTRAVENOUS

## 2015-04-22 MED ORDER — SULFAMETHOXAZOLE-TRIMETHOPRIM 800-160 MG PO TABS: 1.0000 | ORAL_TABLET | Freq: Two times a day (BID) | ORAL | Status: DC

## 2015-04-22 MED ORDER — AMLODIPINE BESYLATE 5 MG PO TABS: 5.0000 mg | ORAL_TABLET | Freq: Every day | ORAL | Status: DC

## 2015-04-22 MED ORDER — PANTOPRAZOLE SODIUM 40 MG PO TBEC: 40.0000 mg | DELAYED_RELEASE_TABLET | Freq: Every day | ORAL | Status: DC

## 2015-04-22 MED ORDER — CARVEDILOL 12.5 MG PO TABS: 12.5000 mg | ORAL_TABLET | Freq: Two times a day (BID) | ORAL | Status: DC

## 2015-04-22 MED ORDER — STROKE: EARLY STAGES OF RECOVERY BOOK
Freq: Once | Status: AC
  Administered 2015-04-22
  Filled 2015-04-22: qty 1

## 2015-04-22 MED ORDER — ASPIRIN 325 MG PO TABS
325.0000 mg | ORAL_TABLET | Freq: Every day | ORAL | Status: DC
  Administered 2015-04-23 – 2015-04-25 (×2): 325 mg via ORAL
  Filled 2015-04-22 (×2): qty 1

## 2015-04-22 MED ORDER — ASPIRIN 300 MG RE SUPP
300.0000 mg | Freq: Every day | RECTAL | Status: DC
  Administered 2015-04-22 – 2015-04-24 (×2): 300 mg via RECTAL
  Filled 2015-04-22 (×2): qty 1

## 2015-04-22 MED ORDER — ALLOPURINOL 100 MG PO TABS: 100.0000 mg | ORAL_TABLET | Freq: Every day | ORAL | Status: DC

## 2015-04-22 MED ORDER — SODIUM CHLORIDE 0.9 % IV BOLUS (SEPSIS)
500.0000 mL | Freq: Once | INTRAVENOUS | Status: AC
  Administered 2015-04-22: 500 mL via INTRAVENOUS

## 2015-04-22 MED ORDER — CEFAZOLIN SODIUM 1-5 GM-% IV SOLN
1.0000 g | Freq: Three times a day (TID) | INTRAVENOUS | Status: DC
  Administered 2015-04-22 – 2015-04-23 (×3): 1 g via INTRAVENOUS
  Filled 2015-04-22 (×5): qty 50

## 2015-04-22 MED ORDER — HEPARIN SODIUM (PORCINE) 5000 UNIT/ML IJ SOLN
5000.0000 [IU] | Freq: Three times a day (TID) | INTRAMUSCULAR | Status: DC
  Administered 2015-04-22 – 2015-04-25 (×8): 5000 [IU] via SUBCUTANEOUS
  Filled 2015-04-22 (×8): qty 1

## 2015-04-22 NOTE — ED Notes (Signed)
Pt to ED via [facility] - code stroke. Pt resident at [facility]. Daughter was with patient at 14:30 this afternoon and pt was normal. At 1700 pt daughter came back to facility and noticed pt to have right gaze and left sided weakness. On arrival to ED pt was cleared at bridge by Sebastian Ache and MD Doy Mince with neurology. Pt is alert and oriented x3. NIH of 18 documented. Pt roomed to B 18 where Dr. Doy Mince spoke with family.

## 2015-04-22 NOTE — ED Notes (Signed)
Dr. Gardner at bedside 

## 2015-04-22 NOTE — ED Notes (Signed)
Attempted to call report

## 2015-04-22 NOTE — Progress Notes (Signed)
Received from ED via stretcher.  Denies pain, speech clear, oriented to person, birthday, age.  Placed condom cath in order to get UA, ED unable to obtain due to obstruction.  Tele applied, no family at bedside presently.

## 2015-04-22 NOTE — H&P (Signed)
[facility] History and Physical  [name] FYB:[id number] DOB: 1926-04-18 DOA: 04/22/2015  Referring physician: EDP PCP: Idelle Crouch, MD   Chief Complaint: Left sided weakness   HPI: [name] is a 81 y.o. male who was recently transferred to SNF from [facility] hospital, after an admission for [facility] UTI and bacteremia.  He was discharged on bactrim to finish course on 7/14.  Discharged to SNF for rehab.  Today was doing well earlier in the day, however after waking up from a nap later in the evening, was noted to have L sided weakness, L facial droop, R gaze with stare.  Patient was brought to ED as code stroke.  Initial NIHSS of 18 and M-Rankin of 4, judged not a candidate for TPA.  Review of Systems: Systems reviewed.  As above, otherwise negative  Past Medical History  Diagnosis Date  . CAD (coronary artery disease)   . Diabetes mellitus, type 2   . GERD (gastroesophageal reflux disease)   . Hyperlipidemia   . Anxiety   . Anemia     iron deficient a. ? h/o GI bleed  . Nephrolithiasis   . Prostate cancer   . Nephrolithiasis   . CHF (congestive heart failure)   . Hypertension    Past Surgical History  Procedure Laterality Date  . Cardiac bypass surgery  2002    five vessel  . Radiation seed implants for prostate cancer    . H/o esoph dilatation      x 2 in past  . Left thr  2004  . Hernia repair  1994  . Total hip arthroplasty  2004   Social History:  reports that he has never smoked. He does not have any smokeless tobacco history on file. He reports that he does not drink alcohol or use illicit drugs.  Allergies  Allergen Reactions  . Iodinated Diagnostic Agents Other (See Comments)    Reaction:  Unknown   . Penicillins Other (See Comments)    Reaction:  Unknown    Family History  Problem Relation Age of Onset  . Heart disease Brother      Prior to Admission medications   Medication Sig Start Date End Date Taking? Authorizing Provider   allopurinol (ZYLOPRIM) 100 MG tablet Take 100 mg by mouth daily.    Historical Provider, MD  amLODipine (NORVASC) 5 MG tablet Take 5 mg by mouth daily.     Historical Provider, MD  carvedilol (COREG) 12.5 MG tablet Take 12.5 mg by mouth 2 (two) times daily.    Historical Provider, MD  omeprazole (PRILOSEC) 20 MG capsule Take 20 mg by mouth daily.    Historical Provider, MD  sulfamethoxazole-trimethoprim (BACTRIM DS,SEPTRA DS) 800-160 MG per tablet Take 1 tablet by mouth every 12 (twelve) hours. 04/13/15   Adrian Prows, MD   Physical Exam: Filed Vitals:   04/22/15 2100  BP: 160/88  Pulse: 66  Temp:   Resp: 18    BP 160/88 mmHg  Pulse 66  Temp(Src) 98.3 F (36.8 C) (Oral)  Resp 18  SpO2 98%  General Appearance:    Alert, oriented, no distress, appears stated age  Head:    Normocephalic, atraumatic  Eyes:    PERRL, EOMI, sclera non-icteric        Nose:   Nares without drainage or epistaxis. Mucosa, turbinates normal  Throat:   Moist mucous membranes. Oropharynx without erythema or exudate.  Neck:   Supple. No carotid bruits.  No thyromegaly.  No lymphadenopathy.  Back:     No CVA tenderness, no spinal tenderness  Lungs:     Clear to auscultation bilaterally, without wheezes, rhonchi or rales  Chest wall:    No tenderness to palpitation  Heart:    Regular rate and rhythm without murmurs, gallops, rubs  Abdomen:     Soft, non-tender, nondistended, normal bowel sounds, no organomegaly  Genitalia:    deferred  Rectal:    deferred  Extremities:   No clubbing, cyanosis or edema.  Pulses:   2+ and symmetric all extremities  Skin:   Skin color, texture, turgor normal, no rashes or lesions  Lymph nodes:   Cervical, supraclavicular, and axillary nodes normal  Neurologic:   Considerable weakness on the LUE and LLE compared to R, 2-3/5 with increased tone on LUE, 1/5 on LLE, 4/5 on R.  Has R sided gaze, unable to cross the midline when following my finger.    Labs on Admission:   Basic Metabolic Panel:  Recent Labs Lab 04/22/15 1843 04/22/15 1853  NA 132* 134*  K 5.8* 5.7*  CL 104 103  CO2 22  --   GLUCOSE 165* 165*  BUN 19 23*  CREATININE 1.35* 1.30*  CALCIUM 8.9  --    Liver Function Tests:  Recent Labs Lab 04/22/15 1843  AST 42*  ALT 27  ALKPHOS 181*  BILITOT 0.4  PROT 5.9*  ALBUMIN 2.5*   No results for input(s): LIPASE, AMYLASE in the last 168 hours. No results for input(s): AMMONIA in the last 168 hours. CBC:  Recent Labs Lab 04/22/15 1843 04/22/15 1853  WBC 9.5  --   NEUTROABS 6.6  --   HGB 12.4* 13.9  HCT 36.9* 41.0  MCV 87.6  --   PLT 353  --    Cardiac Enzymes: No results for input(s): CKTOTAL, CKMB, CKMBINDEX, TROPONINI in the last 168 hours.  BNP (last 3 results) No results for input(s): PROBNP in the last 8760 hours. CBG: No results for input(s): GLUCAP in the last 168 hours.  Radiological Exams on Admission: Ct Head Wo Contrast  04/22/2015   CLINICAL DATA:  81 year old male with left-sided weakness. Code stroke.  EXAM: CT HEAD WITHOUT CONTRAST  TECHNIQUE: Contiguous axial images were obtained from the base of the skull through the vertex without intravenous contrast.  COMPARISON:  No priors.  FINDINGS: Moderate cerebral atrophy with extensive ex vacuo dilatation of the ventricular system. Patchy and confluent areas of decreased attenuation are noted throughout the deep and periventricular white matter of the cerebral hemispheres bilaterally, compatible with chronic microvascular ischemic disease. Physiologic calcifications of the basal ganglia bilaterally. No acute intracranial abnormalities. Specifically, no evidence of acute intracranial hemorrhage, no definite findings of acute/subacute cerebral ischemia, no mass, mass effect, hydrocephalus or abnormal intra or extra-axial fluid collections. Visualized paranasal sinuses and mastoids are well pneumatized. No acute displaced skull fractures are identified.  IMPRESSION: 1.  No acute intracranial abnormalities. 2. Moderate cerebral atrophy with chronic microvascular ischemic changes in the cerebral white matter, as above. I attempt to call these results to Dr. Doy Mince at 6:47 p.m., however, the page went unanswered. Accordingly, these results were called by telephone at the time of interpretation on 04/22/2015 at 6:58 pm to Dr. Davonna Belling, who verbally acknowledged these results.   Electronically Signed   By: Vinnie Langton M.D.   On: 04/22/2015 19:00    EKG: Independently reviewed.  Assessment/Plan Principal Problem:   Stroke Active Problems:   Essential hypertension   Bacteremia due  to Klebsiella pneumoniae   Hyperkalemia   A-fib   1. Stroke - 1. Stroke pathway 2. MRI pending 3. NPO, needs swallow study, unfortunatly already had dysphagia before this stroke, and was considered aspiration risk. 4. ASA ordered PR 5. 2d echo pending 2. A.Fib - 1. Not on any anticoagulants 2. dont see this in his prior history, looks like it was new onset during his admission at [facility] earlier this month judging by EKGs done there. 3. May need rate control meds as we are unable to give norvasc or coreg PO 3. Bacteremia due to K.Pneumo - 1. Stopping bactrim PO 2. Putting him on ancef IV (was sensitive to this as well) 3. Therapy to finish on 7/14 4. Hyperkalemia - 1. Mild hyperkalemia 2. Gentle hydration 3. Recheck potassium in AM with BMP 4. Has slight bump in creatinine to 1.3 from baseline 0.9 but this could be just due to bactrim.  His BUN is actually improved from last hospitalization. 5. HTN - allowing permissive HTN given severity of stroke, currently SBP is only running in the 160s in ED.  Dr. Doy Mince has seen patient for neurology.  Code Status: Full code for now (was DNR during last visit, but most recent NH note from 04/18/15 notes full code status)  Family Communication: Family at bedside Disposition Plan: Admit to inpatient   Time spent: 70  min  GARDNER, JARED M. [facility] Pager [phone]  If 7AM-7PM, please contact the day team taking care of the patient [email or web address] Password [email or web address] 04/22/2015, 10:00 PM

## 2015-04-22 NOTE — ED Notes (Signed)
This nurse unable to I&O cath pt, met resistance.  Dr Alcario Drought, admitting MD, made aware, advised okay to wait for urine sample.

## 2015-04-22 NOTE — ED Notes (Addendum)
Roch Quach daughter asks to be updated [phone] cell [phone] house  Spouse's cell [phone]

## 2015-04-22 NOTE — ED Provider Notes (Signed)
CSN: [id number]     Arrival date & time 04/22/15  1831 History   First MD Initiated Contact with Patient 04/22/15 1840     Chief Complaint  Patient presents with  . Code Stroke   Low 5 caveat due to urgent need for intervention.  (Consider location/radiation/quality/duration/timing/severity/associated sxs/prior Treatment) The history is provided by the patient and the EMS personnel.   patient with left-sided weakness and left-sided neglect. Some difficulty determining the last normal but may have been around 2:30. Patient denies headache. No fevers.  Past Medical History  Diagnosis Date  . CAD (coronary artery disease)   . Diabetes mellitus, type 2   . GERD (gastroesophageal reflux disease)   . Hyperlipidemia   . Anxiety   . Anemia     iron deficient a. ? h/o GI bleed  . Nephrolithiasis   . Prostate cancer   . Nephrolithiasis   . CHF (congestive heart failure)   . Hypertension    Past Surgical History  Procedure Laterality Date  . Cardiac bypass surgery  2002    five vessel  . Radiation seed implants for prostate cancer    . H/o esoph dilatation      x 2 in past  . Left thr  2004  . Hernia repair  1994  . Total hip arthroplasty  2004   Family History  Problem Relation Age of Onset  . Heart disease Brother    History  Substance Use Topics  . Smoking status: Never Smoker   . Smokeless tobacco: Not on file  . Alcohol Use: No    Review of Systems  Unable to perform ROS     Allergies  Iodinated diagnostic agents and Penicillins  Home Medications   Prior to Admission medications   Medication Sig Start Date End Date Taking? Authorizing Provider  allopurinol (ZYLOPRIM) 100 MG tablet Take 100 mg by mouth once a week. Wednesdays   Yes Historical Provider, MD  carvedilol (COREG) 12.5 MG tablet Take 12.5 mg by mouth 2 (two) times daily.   Yes Historical Provider, MD  sulfamethoxazole-trimethoprim (BACTRIM DS,SEPTRA DS) 800-160 MG per tablet Take 1 tablet by mouth  every 12 (twelve) hours. 04/13/15  Yes Adrian Prows, MD  Wound Dressings (HYDROGEL EX) Apply 1 application topically every 3 (three) days.   Yes Historical Provider, MD   BP 106/84 mmHg  Pulse 71  Temp(Src) 98.3 F (36.8 C) (Oral)  Resp 16  Ht 5\' 6"  (1.676 m)  Wt 137 lb 4.8 oz (62.279 kg)  BMI 22.17 kg/m2  SpO2 97% Physical Exam  Constitutional: He appears well-developed.  HENT:  Head: Atraumatic.  Eyes:  Left-sided neglect. Will not look past midline to the left.  Cardiovascular: Normal rate.   Pulmonary/Chest: Effort normal and breath sounds normal.  Abdominal: Soft.  Neurological: He is alert.  Somewhat slumped over the left side. Weak on Left compared to right. Some slight difficulty speaking. Will wiggle toes on both sides. Complete NIH scoring done by neurology.  Skin: Skin is warm.    ED Course  Procedures (including critical care time) Labs Review Labs Reviewed  CBC - Abnormal; Notable for the following:    RBC 4.21 (*)    Hemoglobin 12.4 (*)    HCT 36.9 (*)    RDW 16.2 (*)    All other components within normal limits  COMPREHENSIVE METABOLIC PANEL - Abnormal; Notable for the following:    Sodium 132 (*)    Potassium 5.8 (*)    Glucose,  Bld 165 (*)    Creatinine, Ser 1.35 (*)    Total Protein 5.9 (*)    Albumin 2.5 (*)    AST 42 (*)    Alkaline Phosphatase 181 (*)    GFR calc non Af Amer 46 (*)    GFR calc Af Amer 53 (*)    All other components within normal limits  LIPID PANEL - Abnormal; Notable for the following:    HDL 31 (*)    All other components within normal limits  BASIC METABOLIC PANEL - Abnormal; Notable for the following:    Sodium 132 (*)    CO2 19 (*)    Glucose, Bld 114 (*)    Calcium 8.7 (*)    GFR calc non Af Amer 53 (*)    All other components within normal limits  I-STAT CHEM 8, ED - Abnormal; Notable for the following:    Sodium 134 (*)    Potassium 5.7 (*)    BUN 23 (*)    Creatinine, Ser 1.30 (*)    Glucose, Bld 165 (*)     All other components within normal limits  ETHANOL  PROTIME-INR  APTT  DIFFERENTIAL  URINE RAPID DRUG SCREEN, HOSP PERFORMED  URINALYSIS, ROUTINE W REFLEX MICROSCOPIC (NOT AT [facility])  HEMOGLOBIN A1C  I-STAT TROPOININ, ED    Imaging Review Ct Head Wo Contrast  04/22/2015   CLINICAL DATA:  81 year old male with left-sided weakness. Code stroke.  EXAM: CT HEAD WITHOUT CONTRAST  TECHNIQUE: Contiguous axial images were obtained from the base of the skull through the vertex without intravenous contrast.  COMPARISON:  No priors.  FINDINGS: Moderate cerebral atrophy with extensive ex vacuo dilatation of the ventricular system. Patchy and confluent areas of decreased attenuation are noted throughout the deep and periventricular white matter of the cerebral hemispheres bilaterally, compatible with chronic microvascular ischemic disease. Physiologic calcifications of the basal ganglia bilaterally. No acute intracranial abnormalities. Specifically, no evidence of acute intracranial hemorrhage, no definite findings of acute/subacute cerebral ischemia, no mass, mass effect, hydrocephalus or abnormal intra or extra-axial fluid collections. Visualized paranasal sinuses and mastoids are well pneumatized. No acute displaced skull fractures are identified.  IMPRESSION: 1. No acute intracranial abnormalities. 2. Moderate cerebral atrophy with chronic microvascular ischemic changes in the cerebral white matter, as above. I attempt to call these results to Dr. Doy Mince at 6:47 p.m., however, the page went unanswered. Accordingly, these results were called by telephone at the time of interpretation on 04/22/2015 at 6:58 pm to Dr. Davonna Belling, who verbally acknowledged these results.   Electronically Signed   By: Vinnie Langton M.D.   On: 04/22/2015 19:00   Mr Brain Wo Contrast  04/23/2015   CLINICAL DATA:  Initial valuation for acute left-sided weakness, left facial droop, rightward gaze. Recent hospitalization for  UTI and bacteremia.  EXAM: MRI HEAD WITHOUT CONTRAST  MRA HEAD WITHOUT CONTRAST  TECHNIQUE: Multiplanar, multiecho pulse sequences of the brain and surrounding structures were obtained without intravenous contrast. Angiographic images of the head were obtained using MRA technique without contrast.  COMPARISON:  Prior CT from 04/22/2015  FINDINGS: MRI HEAD FINDINGS  Diffuse prominence of the CSF containing spaces is compatible with generalized age-related cerebral atrophy. Fairly mild chronic small vessel ischemic type changes present within the periventricular white matter. Chronic small vessel type changes present within the central pons as well. Probable small remote lacunar infarcts within the right basal ganglia. Tiny remote lacunar infarct within the left caudate as well.  There  is confluent abnormal restricted diffusion involving the right parieto-occipital cortex, compatible with acute right MCA territory infarct (series 3, image 29). There is associated gyral swelling with edema within the area of infarction. Petechial/cortical hemorrhage present within the right parietal lobe on gradient echo sequence (series 7, image 16). Small serpiginous infarct within the mesial right temporal lobe as well (series 3, image 21). Additional small infarct involves the right caudate (series 3, image 29). These are also consistent with an MCA territory infarction. No significant mass effect or midline shift. Poor flow void within the distal right vertebral artery. Flow voids otherwise maintained.  No mass lesion, midline shift, or mass effect. No extra-axial fluid collection. Ventricular prominence noted, likely related to global parenchymal volume loss. No hydrocephalus.  Craniocervical junction within normal limits. Mild degenerative changes noted within the visualized upper cervical spine. Pituitary gland normal.  No acute abnormality about the orbits. Paranasal sinuses are clear. No mastoid effusion. Inner ear structures  grossly normal.  Bone marrow signal intensity within normal limits. No scalp soft tissue abnormality.  MRA HEAD FINDINGS  ANTERIOR CIRCULATION:  Visualized distal cervical segments of the internal carotid arteries are widely patent with antegrade flow. The petrous and cavernous segments arm patent. There is moderate narrowing of the supra clinoid left ICA. Right supra clinoid segment widely patent. Right A1 segment widely patent. Left A1 segment slightly hypoplastic but patent. Anterior communicating artery normal. Anterior cerebral arteries well opacified.  M1 segments patent bilaterally without high-grade stenosis or occlusion. MCA bifurcations within normal limits. No proximal or early branch occlusion identified on the right. On the left, there is attenuation of the distal left MCA branches with possible occlusion of a proximal left M2 branch at the base of the sylvian fissure (series 601, image 9). This is most certainly chronic in nature given the lack of acute infarct in this cerebral hemisphere.  POSTERIOR CIRCULATION:  Left vertebral artery is dominant and widely patent to the vertebrobasilar junction. The diminutive right vertebral artery is not well visualized, and may be occluded or have slow flow. Distal right vertebral artery is visualize beyond the takeoff of the right posterior inferior cerebral artery. Posterior inferior cerebral arteries themselves are grossly patent. Basilar artery widely patent. Possible short segment high-grade stenosis within the proximal right superior cerebellar artery. Left superior cerebellar artery well opacified. Posterior cerebral arteries arise from the basilar artery bilaterally. Right posterior cerebral artery patent to its distal aspect. There is attenuation and possible occlusion of the distal left PCA. Again, this is likely chronic in nature given the lack of infarct in this territory.  No aneurysm or vascular malformation.  IMPRESSION: MRI HEAD IMPRESSION:  1.  Large acute right MCA territory infarct involving the right parieto-occipital and temporal lobes with additional infarct within the right caudate. There is associated petechial/cortical hemorrhage within the right parietal region. 2. Small remote lacunar infarcts involving the bilateral basal ganglia. 3. Moderate age-related cerebral atrophy with mild chronic microvascular ischemic disease.  MRA HEAD IMPRESSION:  1. No acute large vessel or proximal branch occlusion identified within the intracranial circulation. 2. Attenuation of the distal left MCA branches with possible occlusion of a proximal left M2 branch. This is likely chronic in nature given the lack of acute infarct in this vascular territory. 3. Apparent occlusion of the mid left PCA. Again, this is felt to be chronic in nature given the lack of acute infarct in this vascular territory.  4. Poor visualization of the right vertebral artery, which may  be due to slow flow versus occlusion proximally within the neck. The dominant left vertebral artery is widely patent as is the basilar artery.   Electronically Signed   By: Jeannine Boga M.D.   On: 04/23/2015 06:11   Mr Jodene Nam Head/brain Wo Cm  04/23/2015   CLINICAL DATA:  Initial valuation for acute left-sided weakness, left facial droop, rightward gaze. Recent hospitalization for UTI and bacteremia.  EXAM: MRI HEAD WITHOUT CONTRAST  MRA HEAD WITHOUT CONTRAST  TECHNIQUE: Multiplanar, multiecho pulse sequences of the brain and surrounding structures were obtained without intravenous contrast. Angiographic images of the head were obtained using MRA technique without contrast.  COMPARISON:  Prior CT from 04/22/2015  FINDINGS: MRI HEAD FINDINGS  Diffuse prominence of the CSF containing spaces is compatible with generalized age-related cerebral atrophy. Fairly mild chronic small vessel ischemic type changes present within the periventricular white matter. Chronic small vessel type changes present within the  central pons as well. Probable small remote lacunar infarcts within the right basal ganglia. Tiny remote lacunar infarct within the left caudate as well.  There is confluent abnormal restricted diffusion involving the right parieto-occipital cortex, compatible with acute right MCA territory infarct (series 3, image 29). There is associated gyral swelling with edema within the area of infarction. Petechial/cortical hemorrhage present within the right parietal lobe on gradient echo sequence (series 7, image 16). Small serpiginous infarct within the mesial right temporal lobe as well (series 3, image 21). Additional small infarct involves the right caudate (series 3, image 29). These are also consistent with an MCA territory infarction. No significant mass effect or midline shift. Poor flow void within the distal right vertebral artery. Flow voids otherwise maintained.  No mass lesion, midline shift, or mass effect. No extra-axial fluid collection. Ventricular prominence noted, likely related to global parenchymal volume loss. No hydrocephalus.  Craniocervical junction within normal limits. Mild degenerative changes noted within the visualized upper cervical spine. Pituitary gland normal.  No acute abnormality about the orbits. Paranasal sinuses are clear. No mastoid effusion. Inner ear structures grossly normal.  Bone marrow signal intensity within normal limits. No scalp soft tissue abnormality.  MRA HEAD FINDINGS  ANTERIOR CIRCULATION:  Visualized distal cervical segments of the internal carotid arteries are widely patent with antegrade flow. The petrous and cavernous segments arm patent. There is moderate narrowing of the supra clinoid left ICA. Right supra clinoid segment widely patent. Right A1 segment widely patent. Left A1 segment slightly hypoplastic but patent. Anterior communicating artery normal. Anterior cerebral arteries well opacified.  M1 segments patent bilaterally without high-grade stenosis or  occlusion. MCA bifurcations within normal limits. No proximal or early branch occlusion identified on the right. On the left, there is attenuation of the distal left MCA branches with possible occlusion of a proximal left M2 branch at the base of the sylvian fissure (series 601, image 9). This is most certainly chronic in nature given the lack of acute infarct in this cerebral hemisphere.  POSTERIOR CIRCULATION:  Left vertebral artery is dominant and widely patent to the vertebrobasilar junction. The diminutive right vertebral artery is not well visualized, and may be occluded or have slow flow. Distal right vertebral artery is visualize beyond the takeoff of the right posterior inferior cerebral artery. Posterior inferior cerebral arteries themselves are grossly patent. Basilar artery widely patent. Possible short segment high-grade stenosis within the proximal right superior cerebellar artery. Left superior cerebellar artery well opacified. Posterior cerebral arteries arise from the basilar artery bilaterally. Right posterior cerebral  artery patent to its distal aspect. There is attenuation and possible occlusion of the distal left PCA. Again, this is likely chronic in nature given the lack of infarct in this territory.  No aneurysm or vascular malformation.  IMPRESSION: MRI HEAD IMPRESSION:  1. Large acute right MCA territory infarct involving the right parieto-occipital and temporal lobes with additional infarct within the right caudate. There is associated petechial/cortical hemorrhage within the right parietal region. 2. Small remote lacunar infarcts involving the bilateral basal ganglia. 3. Moderate age-related cerebral atrophy with mild chronic microvascular ischemic disease.  MRA HEAD IMPRESSION:  1. No acute large vessel or proximal branch occlusion identified within the intracranial circulation. 2. Attenuation of the distal left MCA branches with possible occlusion of a proximal left M2 branch. This is  likely chronic in nature given the lack of acute infarct in this vascular territory. 3. Apparent occlusion of the mid left PCA. Again, this is felt to be chronic in nature given the lack of acute infarct in this vascular territory.  4. Poor visualization of the right vertebral artery, which may be due to slow flow versus occlusion proximally within the neck. The dominant left vertebral artery is widely patent as is the basilar artery.   Electronically Signed   By: Jeannine Boga M.D.   On: 04/23/2015 06:11     EKG Interpretation   Date/Time:  Monday April 22 2015 18:57:20 EDT Ventricular Rate:  41 PR Interval:    QRS Duration: 147 QT Interval:  479 QTC Calculation: 502 R Axis:   -64 Text Interpretation:  Atrial fibrillation IVCD, consider atypical RBBB LVH  with secondary repolarization abnormality Inferior infarct, old Anterior  infarct, old simmilar morphology to last tracing Reconfirmed by [facility]   MD, Ovid Curd [phone]) on 04/22/2015 7:58:55 PM      MDM   Final diagnoses:  Stroke   patient admitted with left-sided weakness and left-sided neglect. Some difficulty with last normal period seen by neurology and complete neurologic exam done by them. Negative head CT and not a TPA candidate due to time of onset. Admit to internal medicine.Davonna Belling, MD 04/24/15 [phone]

## 2015-04-22 NOTE — Consult Note (Signed)
Referring Physician: Alvino Chapel    Chief Complaint: Left sided weakness  HPI: [name] is an 81 y.o. male who was recently transferred to SNF from Hospice for therapy.  Today had a good therapy session.  Took a nap afterward.  When he awakened was noted by his daughter to be starring off in to space.  Facial droop was noted.  Speech was slurred.  EMS was called at that time and the patient was brought in as a code stroke.  Initial NIHSS of 18.     Date last known well: Date: 04/22/2015 Time last known well: Time: 14:30 tPA Given: No: Outside time window  Modified Rankin: Rankin Score=4  Past Medical History  Diagnosis Date  . CAD (coronary artery disease)   . Diabetes mellitus, type 2   . GERD (gastroesophageal reflux disease)   . Hyperlipidemia   . Anxiety   . Anemia     iron deficient a. ? h/o GI bleed  . Nephrolithiasis   . Prostate cancer   . Nephrolithiasis   . CHF (congestive heart failure)   . Hypertension     Past Surgical History  Procedure Laterality Date  . Cardiac bypass surgery  2002    five vessel  . Radiation seed implants for prostate cancer    . H/o esoph dilatation      x 2 in past  . Left thr  2004  . Hernia repair  1994  . Total hip arthroplasty  2004    Family History  Problem Relation Age of Onset  . Heart disease Brother    Multiple family members including daughter and mother with esophageal stricture.  Both parents deceased from heart disease.    Social History:  reports that he has never smoked. He does not have any smokeless tobacco history on file. He reports that he does not drink alcohol or use illicit drugs.  Allergies:  Allergies  Allergen Reactions  . Iodinated Diagnostic Agents Other (See Comments)    Reaction:  Unknown   . Penicillins Other (See Comments)    Reaction:  Unknown    Medications: I have reviewed the patient's current medications. Prior to Admission:  Current outpatient prescriptions:  .  allopurinol  (ZYLOPRIM) 100 MG tablet, Take 100 mg by mouth daily., Disp: , Rfl:  .  amLODipine (NORVASC) 5 MG tablet, Take 5 mg by mouth daily. , Disp: , Rfl:  .  carvedilol (COREG) 12.5 MG tablet, Take 12.5 mg by mouth 2 (two) times daily., Disp: , Rfl:  .  omeprazole (PRILOSEC) 20 MG capsule, Take 20 mg by mouth daily., Disp: , Rfl:  .  sulfamethoxazole-trimethoprim (BACTRIM DS,SEPTRA DS) 800-160 MG per tablet, Take 1 tablet by mouth every 12 (twelve) hours., Disp: 20 tablet, Rfl: 0  ROS: Unable to obtain  Physical Examination: Blood pressure 158/78, pulse 64, temperature 97.8 F (36.6 C), temperature source Oral, resp. rate 12, SpO2 98 %.  HEENT-  Normocephalic, no lesions, without obvious abnormality.  Normal external eye and conjunctiva.  Normal TM's bilaterally.  Normal auditory canals and external ears. Normal external nose, mucus membranes and septum.  Normal pharynx. Cardiovascular- S1, S2 normal, pulses palpable throughout   Lungs- chest clear, no wheezing, rales, normal symmetric air entry Abdomen- soft, non-tender; bowel sounds normal; no masses,  no organomegaly Extremities- no edema Lymph-no adenopathy palpable Musculoskeletal-no joint tenderness, deformity or swelling Skin-multple areas of ecchymosis on arms  Neurological Examination Mental Status: Lethargic. Oriented to age but not to month.  Speech fluent but slurred.  Able to follow 3 step commands without difficulty.  Left neglect. Cranial Nerves: II: Discs flat bilaterally; LHH with no blinking to confrontation from the left, pupils equal, round, reactive to light and accommodation III,IV, VI: ptosis not present, right gaze preference V,VII: left facial droop, facial light touch sensation decreased on the left VIII: hearing normal bilaterally IX,X: gag reflex reduced XI: shoulder shrug decreased on the left XII: midline tongue extension Motor: Right : Upper extremity   4/5    Left:     Upper extremity   2-3/5 increased  tone  Lower extremity   4/5     Lower extremity   1/5 Sensory: Pinprick and light touch decreased on the left Deep Tendon Reflexes: 2+ and symmetric throughout Plantars: Right: mute   Left: mute Cerebellar: normal finger-to-nose and normal heel-to-shin testing on the right.  Unable to perform on the left Gait: not tested due to safety issues   Laboratory Studies:  Basic Metabolic Panel:  Recent Labs Lab 04/22/15 1853  NA 134*  K 5.7*  CL 103  GLUCOSE 165*  BUN 23*  CREATININE 1.30*    Liver Function Tests: No results for input(s): AST, ALT, ALKPHOS, BILITOT, PROT, ALBUMIN in the last 168 hours. No results for input(s): LIPASE, AMYLASE in the last 168 hours. No results for input(s): AMMONIA in the last 168 hours.  CBC:  Recent Labs Lab 04/22/15 1843 04/22/15 1853  WBC 9.5  --   NEUTROABS 6.6  --   HGB 12.4* 13.9  HCT 36.9* 41.0  MCV 87.6  --   PLT 353  --     Cardiac Enzymes: No results for input(s): CKTOTAL, CKMB, CKMBINDEX, TROPONINI in the last 168 hours.  BNP: Invalid input(s): POCBNP  CBG: No results for input(s): GLUCAP in the last 168 hours.  Microbiology: Results for orders placed or performed during the hospital encounter of 04/10/15  Culture, blood (routine x 2)     Status: None   Collection Time: 04/10/15  1:42 PM  Result Value Ref Range Status   Specimen Description BLOOD  Final   Special Requests NONE  Final   Culture  Setup Time   Final    GRAM NEGATIVE RODS IN BOTH AEROBIC AND ANAEROBIC BOTTLES CRITICAL RESULT CALLED TO, READ BACK BY AND VERIFIED WITH: DELL HOPKINS AT 3546 04/11/15 DV    Culture   Final    KLEBSIELLA PNEUMONIAE IN BOTH AEROBIC AND ANAEROBIC BOTTLES    Report Status 04/14/2015 FINAL  Final   Organism ID, Bacteria KLEBSIELLA PNEUMONIAE  Final      Susceptibility   Klebsiella pneumoniae - MIC*    AMPICILLIN 16 RESISTANT Resistant     CEFTAZIDIME <=1 SENSITIVE Sensitive     CEFAZOLIN <=4 SENSITIVE Sensitive      CEFTRIAXONE <=1 SENSITIVE Sensitive     CIPROFLOXACIN <=0.25 SENSITIVE Sensitive     GENTAMICIN <=1 SENSITIVE Sensitive     IMIPENEM <=0.25 SENSITIVE Sensitive     TRIMETH/SULFA <=20 SENSITIVE Sensitive     CEFOXITIN <=4 SENSITIVE Sensitive     PIP/TAZO Value in next row Sensitive      SENSITIVE<=4    LEVOFLOXACIN Value in next row Sensitive      SENSITIVE<=0.12    * KLEBSIELLA PNEUMONIAE  Urine culture     Status: None   Collection Time: 04/10/15  1:42 PM  Result Value Ref Range Status   Specimen Description URINE, RANDOM  Final   Special Requests NONE  Final   Culture NO GROWTH 2 DAYS  Final   Report Status 04/12/2015 FINAL  Final  Culture, blood (routine x 2)     Status: None   Collection Time: 04/10/15  1:43 PM  Result Value Ref Range Status   Specimen Description BLOOD  Final   Special Requests NONE  Final   Culture  Setup Time   Final    GRAM NEGATIVE RODS IN BOTH AEROBIC AND ANAEROBIC BOTTLES CRITICAL VALUE NOTED.  VALUE IS CONSISTENT WITH PREVIOUSLY REPORTED AND CALLED VALUE.    Culture KLEBSIELLA PNEUMONIAE  Final   Report Status 04/13/2015 FINAL  Final   Organism ID, Bacteria KLEBSIELLA PNEUMONIAE  Final      Susceptibility   Klebsiella pneumoniae - MIC*    AMPICILLIN 16 RESISTANT Resistant     CEFTAZIDIME <=1 SENSITIVE Sensitive     CEFAZOLIN <=4 SENSITIVE Sensitive     CEFTRIAXONE <=1 SENSITIVE Sensitive     CIPROFLOXACIN <=0.25 SENSITIVE Sensitive     GENTAMICIN <=1 SENSITIVE Sensitive     IMIPENEM <=0.25 SENSITIVE Sensitive     TRIMETH/SULFA <=20 SENSITIVE Sensitive     CEFOXITIN <=4 SENSITIVE Sensitive     PIP/TAZO Value in next row Sensitive      SENSITIVE<=4    LEVOFLOXACIN Value in next row Sensitive      SENSITIVE<=0.12    * KLEBSIELLA PNEUMONIAE    Coagulation Studies: No results for input(s): LABPROT, INR in the last 72 hours.  Urinalysis: No results for input(s): COLORURINE, LABSPEC, PHURINE, GLUCOSEU, HGBUR, BILIRUBINUR, KETONESUR, PROTEINUR,  UROBILINOGEN, NITRITE, LEUKOCYTESUR in the last 168 hours.  Invalid input(s): APPERANCEUR  Lipid Panel: No results found for: CHOL, TRIG, HDL, CHOLHDL, VLDL, LDLCALC  HgbA1C: No results found for: HGBA1C  Urine Drug Screen:  No results found for: LABOPIA, COCAINSCRNUR, LABBENZ, AMPHETMU, THCU, LABBARB  Alcohol Level: No results for input(s): ETH in the last 168 hours.  Other results: EKG: atrial fibrillation, rate 66 bpm.  Imaging: Ct Head Wo Contrast  04/22/2015   CLINICAL DATA:  81 year old male with left-sided weakness. Code stroke.  EXAM: CT HEAD WITHOUT CONTRAST  TECHNIQUE: Contiguous axial images were obtained from the base of the skull through the vertex without intravenous contrast.  COMPARISON:  No priors.  FINDINGS: Moderate cerebral atrophy with extensive ex vacuo dilatation of the ventricular system. Patchy and confluent areas of decreased attenuation are noted throughout the deep and periventricular white matter of the cerebral hemispheres bilaterally, compatible with chronic microvascular ischemic disease. Physiologic calcifications of the basal ganglia bilaterally. No acute intracranial abnormalities. Specifically, no evidence of acute intracranial hemorrhage, no definite findings of acute/subacute cerebral ischemia, no mass, mass effect, hydrocephalus or abnormal intra or extra-axial fluid collections. Visualized paranasal sinuses and mastoids are well pneumatized. No acute displaced skull fractures are identified.  IMPRESSION: 1. No acute intracranial abnormalities. 2. Moderate cerebral atrophy with chronic microvascular ischemic changes in the cerebral white matter, as above. I attempt to call these results to Dr. Doy Mince at 6:47 p.m., however, the page went unanswered. Accordingly, these results were called by telephone at the time of interpretation on 04/22/2015 at 6:58 pm to Dr. Davonna Belling, who verbally acknowledged these results.   Electronically Signed   By: Vinnie Langton M.D.   On: 04/22/2015 19:00    Assessment: 81 y.o. male presenting with acute onset left sided weakness, right gaze preference and LHH.  EKG shows atrial fibrillation.  On no antiplatelet or anticoagulation.  Head CT personally reviewed and shows  no acute changes.  Patient outside window for tPA.  mRankin of 4 therefore not an intervention candidate.  Further work up recommended.    Stroke Risk Factors - atrial fibrillation, diabetes mellitus, hyperlipidemia and hypertension  Plan: 1. HgbA1c, fasting lipid panel 2. MRI, MRA  of the brain without contrast 3. PT consult, OT consult, Speech consult 4. Echocardiogram 5. Carotid dopplers 6. Prophylactic therapy-Antiplatelet med: Aspirin - dose 300 mg rectally 7. NPO until RN stroke swallow screen 8. Telemetry monitoring 9. Frequent neuro checks  Case discussed with Dr. Mordecai Maes, MD [facility] [phone] 04/22/2015, 7:08 PM

## 2015-04-23 ENCOUNTER — Inpatient Hospital Stay (HOSPITAL_COMMUNITY): Payer: Medicare Other

## 2015-04-23 DIAGNOSIS — I639 Cerebral infarction, unspecified: Secondary | ICD-10-CM

## 2015-04-23 DIAGNOSIS — I6789 Other cerebrovascular disease: Secondary | ICD-10-CM

## 2015-04-23 DIAGNOSIS — I63131 Cerebral infarction due to embolism of right carotid artery: Secondary | ICD-10-CM

## 2015-04-23 LAB — RAPID URINE DRUG SCREEN, HOSP PERFORMED
AMPHETAMINES: NOT DETECTED
Barbiturates: NOT DETECTED
Benzodiazepines: NOT DETECTED
COCAINE: NOT DETECTED
Opiates: NOT DETECTED
Tetrahydrocannabinol: NOT DETECTED

## 2015-04-23 LAB — LIPID PANEL
CHOL/HDL RATIO: 4.8 ratio
CHOLESTEROL: 148 mg/dL (ref 0–200)
HDL: 31 mg/dL — ABNORMAL LOW (ref >40)
LDL Cholesterol: 98 mg/dL (ref 0–99)
Triglycerides: 97 mg/dL (ref <150)
VLDL: 19 mg/dL (ref 0–40)

## 2015-04-23 LAB — BASIC METABOLIC PANEL
Anion gap: 7 (ref 5–15)
BUN: 16 mg/dL (ref 6–20)
CALCIUM: 8.7 mg/dL — AB (ref 8.9–10.3)
CO2: 19 mmol/L — ABNORMAL LOW (ref 22–32)
Chloride: 106 mmol/L (ref 101–111)
Creatinine, Ser: 1.2 mg/dL (ref 0.61–1.24)
GFR calc Af Amer: >60 mL/min (ref >60)
GFR calc non Af Amer: 53 mL/min — ABNORMAL LOW (ref >60)
GLUCOSE: 114 mg/dL — AB (ref 65–99)
Potassium: 5.1 mmol/L (ref 3.5–5.1)
Sodium: 132 mmol/L — ABNORMAL LOW (ref 135–145)

## 2015-04-23 LAB — URINALYSIS, ROUTINE W REFLEX MICROSCOPIC
Bilirubin Urine: NEGATIVE
GLUCOSE, UA: NEGATIVE mg/dL
Hgb urine dipstick: NEGATIVE
Ketones, ur: NEGATIVE mg/dL
Leukocytes, UA: NEGATIVE
Nitrite: NEGATIVE
Protein, ur: NEGATIVE mg/dL
SPECIFIC GRAVITY, URINE: 1.008 (ref 1.005–1.030)
UROBILINOGEN UA: 0.2 mg/dL (ref 0.0–1.0)
pH: 7 (ref 5.0–8.0)

## 2015-04-23 MED ORDER — SULFAMETHOXAZOLE-TRIMETHOPRIM 200-40 MG/5ML PO SUSP
20.0000 mL | Freq: Two times a day (BID) | ORAL | Status: DC
  Administered 2015-04-23: 20 mL via ORAL
  Filled 2015-04-23 (×4): qty 20

## 2015-04-23 MED ORDER — ADULT MULTIVITAMIN W/MINERALS CH
1.0000 | ORAL_TABLET | Freq: Every day | ORAL | Status: DC
  Filled 2015-04-23: qty 1

## 2015-04-23 MED ORDER — SULFAMETHOXAZOLE-TRIMETHOPRIM 800-160 MG PO TABS: 1.0000 | ORAL_TABLET | Freq: Two times a day (BID) | ORAL | Status: DC

## 2015-04-23 MED ORDER — PRO-STAT SUGAR FREE PO LIQD
30.0000 mL | Freq: Every day | ORAL | Status: DC
  Administered 2015-04-25: 30 mL via ORAL
  Filled 2015-04-23 (×2): qty 30

## 2015-04-23 MED ORDER — ENSURE ENLIVE PO LIQD
237.0000 mL | Freq: Two times a day (BID) | ORAL | Status: DC
  Administered 2015-04-24 – 2015-04-25 (×2): 237 mL via ORAL

## 2015-04-23 NOTE — Progress Notes (Signed)
Clean Catch urine to lab.  Patient had episode of large amt incontinence, cleansed & able to void in sterile cup.  To MRI via bed.

## 2015-04-23 NOTE — Progress Notes (Signed)
Bilateral carotid artery duplex completed:  1-39% ICA stenosis.  Vertebral artery flow is antegrade.     

## 2015-04-23 NOTE — Progress Notes (Addendum)
Initial Nutrition Assessment  DOCUMENTATION CODES:  Non-severe (moderate) malnutrition in context of chronic illness  INTERVENTION: Ensure Enlive po BID, each supplement provides 350 kcal and 20 grams of protein Provide 30 ml Pro-stat once daily, provides 15 grams of protein and 100 kcal Provide Multivitamin with minerals daily  NUTRITION DIAGNOSIS:  Increased nutrient needs related to wound healing as evidenced by estimated needs.   GOAL:  Patient will meet greater than or equal to 90% of their needs   MONITOR:  PO intake, Supplement acceptance, Labs, Weight trends, Skin  REASON FOR ASSESSMENT:  Low Braden    ASSESSMENT:  81 y.o. male who was recently transferred to SNF from [facility] hospital, after an admission for [facility] UTI and bacteremia. He was discharged on bactrim to finish course on 7/14. Discharged to SNF for rehab. Today was doing well earlier in the day, however after waking up from a nap later in the evening, was noted to have L sided weakness, L facial droop, R gaze with stare.   Pt very lethargic at time of visit.  Pt appears malnourished with moderate muscle wasting of his clavicles and legs and mild fat wasting of arms and orbital region. He was unable to report on how he has been eating recently but, replied yes to wanting to try Ensure supplements.  Labs: low sodium, low calcium  Diet Order:  DIET DYS 3 Room service appropriate?: Yes with Assist; Fluid consistency:: Thin  Skin:  Wound (see comment) (stage III pressure ulcer on coccyx, stage II pressure ulcer on sacrum)  Last BM:  PTA  Height:  Ht Readings from Last 1 Encounters:  04/22/15 5\' 6"  (1.676 m)    Weight:  Wt Readings from Last 1 Encounters:  04/22/15 137 lb 4.8 oz (62.279 kg)    Ideal Body Weight:  64.5 kg  Wt Readings from Last 10 Encounters:  04/22/15 137 lb 4.8 oz (62.279 kg)  04/12/15 134 lb 8 oz (61.009 kg)  07/05/14 144 lb (65.318 kg)  11/06/13 144 lb 8 oz (65.545  kg)  05/15/13 146 lb 12 oz (66.565 kg)  10/17/12 134 lb 8 oz (61.009 kg)  04/15/12 138 lb (62.596 kg)  12/10/11 139 lb 8 oz (63.277 kg)  11/26/10 148 lb 8 oz (67.359 kg)  11/06/10 145 lb (65.772 kg)    BMI:  Body mass index is 22.17 kg/(m^2).  Estimated Nutritional Needs:  Kcal:  1700-1900  Protein:  80-90 grams  Fluid:  1.7-1.9 L/day  EDUCATION NEEDS:  No education needs identified at this time    Pryor Ochoa RD, LDN Inpatient Clinical Dietitian Pager: [phone] After Hours Pager: [phone]

## 2015-04-23 NOTE — Evaluation (Signed)
Speech Language Pathology Evaluation Patient Details Name: [name] MRN: [id number] DOB: Feb 26, 1926 Today's Date: 04/23/2015 Time: 1245-8099 SLP Time Calculation (min) (ACUTE ONLY): 11 min  Problem List:  Patient Active Problem List   Diagnosis Date Noted  . Stroke 04/22/2015  . Hyperkalemia 04/22/2015  . A-fib 04/22/2015  . Pressure ulcer 04/15/2015  . Fever 04/13/2015  . Bacteremia due to Klebsiella pneumoniae 04/13/2015  . Sepsis 04/10/2015  . UTI (urinary tract infection) 07/05/2014  . Aortic valve stenosis 12/10/2011  . DIABETES MELLITUS 11/05/2010  . HYPERLIPIDEMIA 11/05/2010  . Essential hypertension 11/05/2010  . MURMUR 11/05/2010  . CHEST PAIN UNSPECIFIED 11/05/2010  . CAD, UNSPECIFIED SITE 04/04/2009   Past Medical History:  Past Medical History  Diagnosis Date  . CAD (coronary artery disease)   . Diabetes mellitus, type 2   . GERD (gastroesophageal reflux disease)   . Hyperlipidemia   . Anxiety   . Anemia     iron deficient a. ? h/o GI bleed  . Nephrolithiasis   . Prostate cancer   . Nephrolithiasis   . CHF (congestive heart failure)   . Hypertension    Past Surgical History:  Past Surgical History  Procedure Laterality Date  . Cardiac bypass surgery  2002    five vessel  . Radiation seed implants for prostate cancer    . H/o esoph dilatation      x 2 in past  . Left thr  2004  . Hernia repair  1994  . Total hip arthroplasty  2004   HPI:  81 yo male adm to [facility] with left droop, right gaze preference.  Pt found to have right MCA CVA.  h/o GERD w/ Esophageal stricture and dilitations(x4 per pt - records indicate x3), sepsis due to UTI prompting admission to [facility] and dc to [facility].  Swallow and speech evaluation ordered.     Assessment / Plan / Recommendation Clinical Impression  Pt presents with moderate cognitive linguistic deficits impacting areas of attention, orientation, and processing.  Pt was oriented to place and self but not  situation nor date.  He did state election year and candidates as well as choosing current year with choice of 2.  Very delayed responses noted with pt responding to SLP approximately 75% of opportunities.  SLP questions baseline cognitive abilities?   Suspect inattention to left significant - pt did read his name in large print with verbal/visual/tactile scanning to left side but could not read other words despite glasses/cues.  Daughter reports that pt reads the newspaper some and pt reported this is a goal for his to regain ability if able.  Skilled SLP warranted to maximize functional cognitive lingusitic abilities to decrease caregiver burden. Requested daughter stay to pt's left side as much as able to maximize attention.      SLP Assessment  Patient needs continued Speech Lanaguage Pathology Services    Follow Up Recommendations  Skilled Nursing facility    Frequency and Duration min 2x/week  2 weeks   Pertinent Vitals/Pain Pain Assessment: No/denies pain   SLP Goals  Progression toward goals: Progressing toward goals Patient/Family Stated Goal: for pt to get better, "Is he getting more sensation back?" Potential to Achieve Goals (ACUTE ONLY): Fair Potential Considerations (ACUTE ONLY): Ability to learn/carryover information;Previous level of function;Severity of impairments  SLP Evaluation Prior Functioning  Cognitive/Linguistic Baseline:  (daughter reports pt doing as well now communicating as baseline, wife not present) Type of Home: [place] (at [facility] for  rehab after recent hospital stay for sepsis)   Cognition  Overall Cognitive Status: Impaired/Different from baseline Arousal/Alertness: Awake/alert Orientation Level: Oriented to person;Disoriented to place;Disoriented to time;Disoriented to situation Attention: Sustained Sustained Attention: Impaired (decreased attention to left side ) Sustained Attention Impairment: Functional basic Memory:   (recalled having MRI this am) Awareness: Impaired (decreased awareness to left inattention) Awareness Impairment: Intellectual impairment    Comprehension  Auditory Comprehension Overall Auditory Comprehension: Impaired Yes/No Questions: Not tested Commands: Impaired One Step Basic Commands: 50-74% accurate (inconsistent response to one step commands) Interfering Components: Attention;Processing speed EffectiveTechniques: Extra processing time;Repetition Reading Comprehension Reading Status: Impaired Word level: Impaired (pt read his name with delay and max cues after not being able to read sign, unable to read swallow precaution sign) Interfering Components: Attention;Visual scanning;Left neglect/inattention;Processing time Effective Techniques: Large print;Eye glasses;Verbal cueing;Tactile cueing;Visual cueing    Expression Expression Primary Mode of Expression: Verbal Verbal Expression Overall Verbal Expression: Appears within functional limits for tasks assessed Initiation: Impaired Level of Generative/Spontaneous Verbalization: Sentence Repetition:  (dnt) Naming: Not tested Pragmatics: Unable to assess Interfering Components: Attention Non-Verbal Means of Communication: Not applicable Written Expression Dominant Hand: Right Written Expression: Not tested   Oral / Motor Oral Motor/Sensory Function Overall Oral Motor/Sensory Function: Appears within functional limits for tasks assessed (generalized weakness, no focal CN deficits) Motor Speech Overall Motor Speech: Impaired Respiration: Within functional limits Phonation: Hoarse;Low vocal intensity Resonance: Within functional limits Articulation: Impaired Level of Impairment: Sentence Intelligibility: Intelligible Motor Planning: Witnin functional limits Motor Speech Errors: Not applicable   GO     Claudie Fisherman, [facility] SLP [phone]

## 2015-04-23 NOTE — Evaluation (Signed)
Occupational Therapy Evaluation Patient Details Name: [name] MRN: [id number] DOB: [birth year] Today's Date: 04/23/2015    History of Present Illness 81 y.o. male who was recently transferred to SNF from [facility] hospital, after an admission for UTI.Discharged to SNF for rehab. On 04/22/15 after waking up from a nap later was noted to have L sided weakness, L facial droop, R gaze with stare.   Clinical Impression   Pt oriented to person and place, unable to explain why he is in the hospital or where he was previously. Pt unable to attend to L side. Pt sat EOB for 3 minutes becoming nauseous and needing to return to bed. OT attempted to reach daughter to determine PLOF; unable to reach by phone. Pt would benefit from skilled OT for increased safety and independence with ADLs.    Follow Up Recommendations  SNF;Supervision/Assistance - 24 hour    Equipment Recommendations  Other (comment) (TBD next venue)    Recommendations for Other Services       Precautions / Restrictions Precautions Precautions: Fall Restrictions Weight Bearing Restrictions: No      Mobility Bed Mobility Overal bed mobility: Needs Assistance;+2 for physical assistance Bed Mobility: Rolling;Sidelying to Sit;Sit to Supine Rolling: Max assist Sidelying to sit: +2 for physical assistance;Max assist;HOB elevated   Sit to supine: Max assist;+2 for physical assistance;HOB elevated   General bed mobility comments: Pt became nauseous upon sitting EOB  Transfers                 General transfer comment: Pt unable to transfer from bed during session due to nausea upon sitting and need to return to bed    Balance Overall balance assessment: Needs assistance Sitting-balance support: Single extremity supported;Feet supported Sitting balance-Leahy Scale: Fair Sitting balance - Comments: Able to sit EOB holding onto bedrail with R UE and feet supported                                     ADL Overall ADL's : Needs assistance/impaired Eating/Feeding: NPO   Grooming: Wash/dry face;Maximal assistance;Bed level Grooming Details (indicate cue type and reason): Unable to attend to L side of face; required assist to bring washcloth to eyes due to decreased ROM Upper Body Bathing: Maximal assistance;Sitting   Lower Body Bathing: Maximal assistance;Sit to/from stand   Upper Body Dressing : Maximal assistance;Sitting   Lower Body Dressing: Maximal assistance;Sit to/from stand;+2 for physical assistance   Toilet Transfer: Maximal assistance;+2 for physical assistance   Toileting- Clothing Manipulation and Hygiene: Maximal assistance;Sit to/from stand       Functional mobility during ADLs: Maximal assistance;+2 for physical assistance General ADL Comments: Unable to fully assess ADLs at this time; Pt sat EOB for 3 minutes becoming nauseous and returned to bed. No family present to determine PLOF.     Vision Vision Assessment?: Vision impaired- to be further tested in functional context Additional Comments: Pt has L neglect; gaze right   Perception     Praxis      Pertinent Vitals/Pain Pain Assessment: No/denies pain     Hand Dominance Right   Extremity/Trunk Assessment Upper Extremity Assessment Upper Extremity Assessment: RUE deficits/detail;LUE deficits/detail RUE Deficits / Details: 3-/5 MMT; able to bring hand to mouth but not to top of head RUE Coordination: decreased gross motor LUE Deficits / Details: Pt with L neglect; 3-/5MMT, unable to bring hand to mouth; additional time required to  extend fingers LUE Coordination: decreased gross motor;decreased fine motor   Lower Extremity Assessment Lower Extremity Assessment: Generalized weakness   Cervical / Trunk Assessment Cervical / Trunk Assessment: Kyphotic   Communication Communication Communication: No difficulties   Cognition Arousal/Alertness: Awake/alert Behavior During Therapy: Flat  affect Overall Cognitive Status: Impaired/Different from baseline                     General Comments       Exercises       Shoulder Instructions      Home Living Family/patient expects to be discharged to:: Unsure     Type of Home: [facility] (at [facility] for rehab after recent hospital stay for sepsis)                                  Prior Functioning/Environment               OT Diagnosis: Generalized weakness;Disturbance of vision;Hemiplegia dominant side   OT Problem List: Decreased strength;Decreased range of motion;Decreased activity tolerance;Impaired balance (sitting and/or standing);Impaired vision/perception;Decreased safety awareness;Decreased knowledge of use of DME or AE;Impaired UE functional use   OT Treatment/Interventions: Self-care/ADL training;Therapeutic exercise;DME and/or AE instruction;Therapeutic activities;Visual/perceptual remediation/compensation;Patient/family education;Balance training    OT Goals(Current goals can be found in the care plan section) Acute Rehab OT Goals Patient Stated Goal: none stated OT Goal Formulation: Patient unable to participate in goal setting  OT Frequency: Min 3X/week   Barriers to D/C:            Co-evaluation              End of Session Nurse Communication: Mobility status  Activity Tolerance: Patient limited by lethargy Patient left: in bed;with call bell/phone within reach;with bed alarm set   Time: 0822-0900 OT Time Calculation (min): 38 min Charges:  OT General Charges $OT Visit: 1 Procedure OT Evaluation $Initial OT Evaluation Tier I: 1 Procedure OT Treatments $Self Care/Home Management : 8-22 mins G-Codes:    Forest Gleason 04/23/2015, 12:02 PM

## 2015-04-23 NOTE — Progress Notes (Signed)
STROKE TEAM PROGRESS NOTE   HISTORY [name] is an 81 y.o. male who was recently transferred to SNF from Hospice for therapy. Today had a good therapy session. Took a nap afterward. When he awakened was noted by his daughter to be starring off in to space. Facial droop was noted. Speech was slurred. EMS was called at that time and the patient was brought in as a code stroke. Initial NIHSS of 18.   Date last known well: Date: 04/22/2015 Time last known well: Time: 14:30 tPA Given: No: Outside time window  Modified Rankin: Rankin Score=4   SUBJECTIVE (INTERVAL HISTORY) His daughter and pastor are at the bedside.  Overall he feels his condition is unchanged. Daughter reports his wife is struggling physically, and could not be here today. Daughter with good understanding of disease and care. Prefers quality over quantity of life for her dad. Feels he and her mother would agree. Patient has recently hospitalized at nursing home with hospice following hospital admission for sepsis   OBJECTIVE Temp:  [97.7 F (36.5 C)-99 F (37.2 C)] 99 F (37.2 C) (07/12 1013) Pulse Rate:  [60-83] 70 (07/12 1013) Cardiac Rhythm:  [-] Atrial fibrillation (07/12 0600) Resp:  [12-24] 16 (07/12 1013) BP: (128-164)/(58-97) 128/69 mmHg (07/12 1013) SpO2:  [97 %-100 %] 98 % (07/12 1013) Weight:  [62.279 kg (137 lb 4.8 oz)] 62.279 kg (137 lb 4.8 oz) (07/11 2334)  No results for input(s): GLUCAP in the last 168 hours.  Recent Labs Lab 04/22/15 1843 04/22/15 1853 04/23/15 0516  NA 132* 134* 132*  K 5.8* 5.7* 5.1  CL 104 103 106  CO2 22  --  19*  GLUCOSE 165* 165* 114*  BUN 19 23* 16  CREATININE 1.35* 1.30* 1.20  CALCIUM 8.9  --  8.7*    Recent Labs Lab 04/22/15 1843  AST 42*  ALT 27  ALKPHOS 181*  BILITOT 0.4  PROT 5.9*  ALBUMIN 2.5*    Recent Labs Lab 04/22/15 1843 04/22/15 1853  WBC 9.5  --   NEUTROABS 6.6  --   HGB 12.4* 13.9  HCT 36.9* 41.0  MCV 87.6  --   PLT 353   --    No results for input(s): CKTOTAL, CKMB, CKMBINDEX, TROPONINI in the last 168 hours.  Recent Labs  04/22/15 1843  LABPROT 14.6  INR 1.12    Recent Labs  04/23/15 0420  COLORURINE YELLOW  LABSPEC 1.008  PHURINE 7.0  GLUCOSEU NEGATIVE  HGBUR NEGATIVE  BILIRUBINUR NEGATIVE  KETONESUR NEGATIVE  PROTEINUR NEGATIVE  UROBILINOGEN 0.2  NITRITE NEGATIVE  LEUKOCYTESUR NEGATIVE       Component Value Date/Time   CHOL 148 04/23/2015 0515   TRIG 97 04/23/2015 0515   HDL 31* 04/23/2015 0515   CHOLHDL 4.8 04/23/2015 0515   VLDL 19 04/23/2015 0515   LDLCALC 98 04/23/2015 0515   No results found for: HGBA1C    Component Value Date/Time   LABOPIA NONE DETECTED 04/23/2015 0420   COCAINSCRNUR NONE DETECTED 04/23/2015 0420   LABBENZ NONE DETECTED 04/23/2015 0420   AMPHETMU NONE DETECTED 04/23/2015 0420   THCU NONE DETECTED 04/23/2015 0420   LABBARB NONE DETECTED 04/23/2015 0420     Recent Labs Lab 04/22/15 1843  ETH <5    Ct Head Wo Contrast 04/22/2015    1. No acute intracranial abnormalities. 2. Moderate cerebral atrophy with chronic microvascular ischemic changes in the cerebral white matter  MRI HEAD  04/23/2015    IMPRESSION:  1. Large  acute right MCA territory infarct involving the right parieto-occipital and temporal lobes with additional infarct within the right caudate. There is associated petechial/cortical hemorrhage within the right parietal region. 2. Small remote lacunar infarcts involving the bilateral basal ganglia. 3. Moderate age-related cerebral atrophy with mild chronic microvascular ischemic disease.    MRA HEAD  04/23/2015    IMPRESSION:  1. No acute large vessel or proximal branch occlusion identified within the intracranial circulation. 2. Attenuation of the distal left MCA branches with possible occlusion of a proximal left M2 branch. This is likely chronic in nature given the lack of acute infarct in this vascular territory. 3. Apparent occlusion of  the mid left PCA. Again, this is felt to be chronic in nature given the lack of acute infarct in this vascular territory.  4. Poor visualization of the right vertebral artery, which may be due to slow flow versus occlusion proximally within the neck. The dominant left vertebral artery is widely patent as is the basilar artery.       PHYSICAL EXAM Frail elderly caucasian male not in distress. . Afebrile. Head is nontraumatic. Neck is supple without bruit.    Cardiac exam no murmur or gallop. Lungs are clear to auscultation. Distal pulses are well felt. Neurological Exam :  Awake alert oriented 2. Diminished attention, registration and recall. Speech is hypophonic but can be understood with some difficulty. No aphasia or dysarthria. Extraocular movements are full range. Left homonymous hemianopsia. Pupils irregular but equal and reactive. Fundi were not visualized. Mild left lower facial weakness. Tongue midline. Mild left hemiparesis 4/5 strength with significant weakness of left grip and intrinsic hand muscles. Mild weakness of left hip and ankle dorsiflexors. Sensation is intact. Deep tendon reflexes are symmetric. Plantars are downgoing. Gait was not tested. ASSESSMENT/PLAN Mr. [name] is a 81 y.o. male with history of end-stage CHF on home hospice with recent hospital admission for UTI and bacteremia, d/c to SNF for rehab prior to return home. At SNF, developed left sided weakness and was brought in as a code stroke. He did not receive IV t-PA due to delay in arrival.   Stroke:  Non-dominant large right MCA and small R caudate infarct embolic secondary to recently diagnosed atrial fibrillation   Resultant  Left hemiparesis  MRI  Large right MCA infarct (right parieto-occipital and temporal lobes) and right caudate infarct. petechial/cortical hemorrhage within the right parietal region  MRA  No large vessel occlusion. L M2 branch occlusion. Mid L PCA occlusion.  Carotid Doppler  pending    2D Echo  pending   LDL 98  HgbA1c pending  Heparin 5000 units sq tid for VTE prophylaxis  DIET DYS 3 Room service appropriate?: Yes with Assist; Fluid consistency:: Thin  no antithrombotic prior to admission, now on aspirin 325 mg orally every day  Therapy recommendations:  pending   Disposition:  Pending. Anticipate need to return to SNF   Atrial Fibrillation  apparently a new dx during recent hospitalization at [facility] anticoagulation:  none  CHA2DS2-VASc Score = 8, ?2 oral anticoagulation recommended  Age in Years:  ?10   +2    Sex:  Male   0    Hypertension History:  yes   +1     Diabetes Mellitus:  yes   +1   Congestive Heart Failure History:  yes   +1  Vascular Disease History:  yes   +1     Stroke/TIA/Thromboembolism History:  yes   +2  Dr. Leonie Man discussed anticoagulation options with daughter. Given pts frail, unsteady state and generalized overall poor medical condidtion, decision made not to place pt on anticoagulation. Will continue aspirin at discharge  Hypertension Permissive hypertension (OK if < 220/120) but gradually normalize in 5-7 days   Stable  Hyperlipidemia  Home meds:  No statin  LDL 98, goal < 70  Given frail state and pt/family desire for quality of quantity, will not add statin at this time  Diabetes  HgbA1c pending, goal < 7.0  Other Stroke Risk Factors  Advanced age  Coronary artery disease  Other Active Problems  Bacteremia d/t K Pneumo. On abx  Hyperkalemia  Hospital day # [place] for Pager information 04/23/2015 12:51 PM  I have personally examined this patient, reviewed notes, independently viewed imaging studies, participated in medical decision making and plan of care. I have made any additions or clarifications directly to the above note. Agree with note above. Patient has presented with embolic right MCA branch infarct and remains at risk for neurological worsening,  recurrent stroke, TIA and needs ongoing stroke evaluation and aggressive risk factor modification. He has new-onset A. fib but after long discussion with the daughter at the bedside patient is not a good long-term anticoagulation candidate even history of falls and gait difficulties and frail body habitus  Antony Contras, [facility] Pager: [phone] 04/23/2015 1:52 PM    To contact Stroke Continuity provider, please refer to [email or web address] After hours, contact General Neurology

## 2015-04-23 NOTE — Evaluation (Signed)
Clinical/Bedside Swallow Evaluation Patient Details  Name: [name] MRN: [id number] Date of Birth: 04-02-1926  Today's Date: 04/23/2015 Time: SLP Start Time (ACUTE ONLY): 9798 SLP Stop Time (ACUTE ONLY): 0955 SLP Time Calculation (min) (ACUTE ONLY): 27 min  Past Medical History:  Past Medical History  Diagnosis Date  . CAD (coronary artery disease)   . Diabetes mellitus, type 2   . GERD (gastroesophageal reflux disease)   . Hyperlipidemia   . Anxiety   . Anemia     iron deficient a. ? h/o GI bleed  . Nephrolithiasis   . Prostate cancer   . Nephrolithiasis   . CHF (congestive heart failure)   . Hypertension    Past Surgical History:  Past Surgical History  Procedure Laterality Date  . Cardiac bypass surgery  2002    five vessel  . Radiation seed implants for prostate cancer    . H/o esoph dilatation      x 2 in past  . Left thr  2004  . Hernia repair  1994  . Total hip arthroplasty  2004   HPI:  81 yo male adm to [facility] with left droop, right gaze preference.  Pt found to have right MCA CVA.  h/o GERD w/ Esophageal stricture and dilitations(x4 per pt - records indicate x3), sepsis due to UTI prompting admission to [place] and dc to [facility].  Swallow and speech evaluation ordered.     Assessment / Plan / Recommendation Clinical Impression  Pt presents w/ mild oral phase deficits c/b oral holding of both foods and liquids, slow mastication/oral manipulation and suspected piecemealing.  No oral residue remained after multiple swallows. No pharyngeal phase deficits observed - coughing or throat clearing occured w/ trials and vocal quality was clear - albeit hoarse.  Daughter present and reports swallow ability observed is at baseline.    Recommend a Dys. 3 diet w/ thin liquids with strict aspiration/reflux precautions. Medicine in puree as pt had difficulty orally coordinating boluses.  Pt has chronic multifactorial dysphagia with acute exacerbation due to current CVA.     Educated daughter and pt to findings/recommendations using demonstration, written precautions to mitigate dysphagia.     Today pt required SLP to feed him, which is a clear change from evaluation conducted during admit for sepsis July 4th, 2016.  Will follow briefly for tolerance, indication for further testing.       Aspiration Risk  Mild (reduced w/ precautions)    Diet Recommendation Thin;Dysphagia 3 (Mech soft)   Medication Administration: Whole meds with puree (crush if large and not contraindicated) Compensations: Slow rate;Small sips/bites (time b/t bites/sips, consume liquids t/o meal, no ice)    Other  Recommendations Oral Care Recommendations: Oral care BID;Oral care before and after PO   Follow Up Recommendations       Frequency and Duration min 2x/week  1 week   Pertinent Vitals/Pain Low grade fever, decreased      Swallow Study Prior Functional Status    see hhx   General Date of Onset: 04/23/15 Other Pertinent Information: 81 yo male adm to [facility] with left droop, right gaze preference.  Pt found to have right MCA CVA.  h/o GERD w/ Esophageal stricture and dilitations(x4 per pt - records indicate x3), sepsis due to UTI prompting admission to [place] and dc to [facility].  Swallow and speech evaluation ordered.   Type of Study: Bedside swallow evaluation Diet Prior to this Study: NPO Temperature Spikes Noted: No Respiratory Status: Room  air History of Recent Intubation: No Behavior/Cognition: Alert;Cooperative;Requires cueing (delayed responses) Oral Cavity - Dentition: Adequate natural dentition/normal for age (missing few dentition) Patient Positioning: Upright in bed Baseline Vocal Quality: Hoarse (daughter and pt report this is baseline) Volitional Cough: Cognitively unable to elicit Volitional Swallow: Unable to elicit    Oral/Motor/Sensory Function Overall Oral Motor/Sensory Function:  (generalized weakness, no focal CN deficits)   Ice Chips Ice  chips: Not tested Other Comments: pt dislikes cold items   Thin Liquid Thin Liquid: Impaired Presentation: Straw;Spoon Oral Phase Impairments: Impaired anterior to posterior transit;Reduced lingual movement/coordination Oral Phase Functional Implications: Oral holding;Prolonged oral transit Pharyngeal  Phase Impairments: Multiple swallows    Nectar Thick Nectar Thick Liquid: Impaired Presentation: Straw Oral Phase Impairments: Reduced lingual movement/coordination;Impaired anterior to posterior transit Oral phase functional implications: Oral holding Pharyngeal Phase Impairments: Multiple swallows   Honey Thick Honey Thick Liquid: Not tested   Puree Puree: Impaired Presentation: Spoon (fed by slp x2 trials) Oral Phase Impairments: Reduced lingual movement/coordination;Impaired anterior to posterior transit (oral holding) Pharyngeal Phase Impairments: Multiple swallows   Solid   GO    Solid: Impaired Oral Phase Impairments: Reduced lingual movement/coordination;Impaired anterior to posterior transit Oral Phase Functional Implications: Other (comment);Oral holding (prolonged oral transiting, no oral residuals)       Luanna Salk, [facility] SLP [phone]

## 2015-04-23 NOTE — Clinical Social Work Note (Signed)
Clinical Social Work Assessment  Patient Details  Name: [name] MRN: [id number] Date of Birth: October 01, 1926  Date of referral:  04/23/15               Reason for consult:  Facility Placement                Housing/Transportation Living arrangements for the past 2 months:  [place], [place] of Information:  Adult Children [name]) Patient Interpreter Needed:  None Criminal Activity/Legal Involvement Pertinent to Current Situation/Hospitalization:  No - Comment as needed Significant Relationships:  Spouse, Adult Children Lives with:  Spouse Do you feel safe going back to the place where you live?  No Need for family participation in patient care:  Yes (Comment)  Care giving concerns:  N/A   Social Worker assessment / plan: CSW met the pt's daughter Jan at bedside. CSW introduced self and purpose of the visit. CSW discussed SNF rehab. Jan reported that the pt come from [facility]. Pt received rehab there from July 4th to 11th.  Jan reported that she does not want the pt to return to [place] due to the distant from the pt's home. Jan is requesting [facility] because it's closer to home. CSW explained the SNF process. CSW explained insurance and its relation to SNF placement. CSW provided the Jan with a SNF list. CSW answered all questions in which Jan inquired about. CSW will continue to follow this pt and assist with discharge as needed.   Employment status:  Retired Nurse, adult PT Recommendations:  [place] / Referral to community resources:  [place]  Patient/Family's Response to care:  Jan reported that the nursing staff has been great with the pt.   Patient/Family's Understanding of and Emotional Response to Diagnosis, Current Treatment, and Prognosis:  Jan reported that she is awaiting to speak with the doctor about the pt's current diagnosis.   Emotional  Assessment Appearance:  Appears stated age Attitude/Demeanor/Rapport:   (Calm ) Affect (typically observed):  Quiet Orientation:  Oriented to Self, Oriented to Place, Oriented to  Time, Oriented to Situation Alcohol / Substance use:  Not Applicable Psych involvement (Current and /or in the community):  No (Comment)  Discharge Needs  Concerns to be addressed:  Denies Needs/Concerns at this time Readmission within the last 30 days:  Yes Current discharge risk:  None Barriers to Discharge:  No Barriers Identified   Tanav Orsak, LCSW 04/23/2015, 1:57 PM

## 2015-04-23 NOTE — Clinical Social Work Placement (Signed)
   CLINICAL SOCIAL WORK PLACEMENT  NOTE  Date:  04/23/2015  Patient Details  Name: [name] MRN: [id number] Date of Birth: 25-Jan-1926  Clinical Social Work is seeking post-discharge placement for this patient at the [place] level of care (*CSW will initial, date and re-position this form in  chart as items are completed):  Yes   Patient/family provided with [facility] Work Department's list of facilities offering this level of care within the geographic area requested by the patient (or if unable, by the patient's family).  Yes   Patient/family informed of their freedom to choose among providers that offer the needed level of care, that participate in Medicare, Medicaid or managed care program needed by the patient, have an available bed and are willing to accept the patient.  Yes   Patient/family informed of [facility]'s ownership interest in [facility] and [facility], as well as of the fact that they are under no obligation to receive care at these facilities.  PASRR submitted to EDS on       PASRR number received on       Existing PASRR number confirmed on 04/23/15     FL2 transmitted to all facilities in geographic area requested by pt/family on 04/23/15     FL2 transmitted to all facilities within larger geographic area on       Patient informed that his/her managed care company has contracts with or will negotiate with certain facilities, including the following:            Patient/family informed of bed offers received.  Patient chooses bed at       Physician recommends and patient chooses bed at      Patient to be transferred to   on  .  Patient to be transferred to facility by       Patient family notified on   of transfer.  Name of family member notified:        PHYSICIAN       Additional Comment:    _______________________________________________ Greta Doom, LCSW 04/23/2015, 2:42 PM

## 2015-04-23 NOTE — Progress Notes (Signed)
PT Cancellation Note  Patient Details Name: [name] MRN: [id number] DOB: 27-Mar-1926   Cancelled Treatment:    Reason Eval/Treat Not Completed: Patient at procedure or test/unavailable . Pt undergoing 2d echogardiogram and then upon return pt off floor at vascular lab. PT to return as able to complete PT Eval.   Kingsley Callander 04/23/2015, 4:05 PM   Kittie Plater, PT, DPT Pager #: [phone] Office #: [phone]

## 2015-04-23 NOTE — Progress Notes (Signed)
Utilization Review Completed.Ruhan Borak T7/09/2015  

## 2015-04-23 NOTE — Progress Notes (Signed)
Echocardiogram 2D Echocardiogram has been performed.  Joelene Millin 04/23/2015, 2:29 PM

## 2015-04-23 NOTE — Progress Notes (Addendum)
[facility] PROGRESS NOTE  [name] PPJ:[id number] DOB: 03-10-1926 DOA: 04/22/2015 PCP: Idelle Crouch, MD  Assessment/Plan: Principal Problem:   Stroke Active Problems:   Essential hypertension   Bacteremia due to Klebsiella pneumoniae   Hyperkalemia   A-fib    Stroke: Non-dominant large right MCA and small R caudate infarct embolic secondary to recently diagnosed atrial fibrillation ,Resultant Left hemiparesis, MRI Large right MCA infarct (right parieto-occipital and temporal lobes) and right caudate infarct. petechial/cortical hemorrhage within the right parietal region, MRA No large vessel occlusion. L M2 branch occlusion. Mid L PCA occlusion. Carotid Doppler pending, 2D Echo pending ,LDL 98, HgbA1c pending,  DIET DYS 3  Dys. 3 diet w/ thin liquids    now on aspirin 325 mg orally every day  Therapy recommendations:SNF  Disposition:Anticipate discharge tomorrow  Atrial Fibrillation currently not on anticoagulation,CHA2DS2-VASc Score = 8, neurology,Dr. Leonie Man discussed anticoagulation options with daughter. Given pts frail, unsteady state and generalized overall poor medical condidtion, decision made not to place pt on anticoagulation. Continue aspirin  Hypertension-resume Coreg at discharge   Hyperlipidemia, Home meds: No statin,LDL 98, goal < 70,Given frail state and pt/family desire for quality of quantity, will not add statin at this time  Diabetes,HgbA1c pending, goal < 7.0    Recent Bacteremia due to Klebsiella pneumoniae, Bactrim DS twice a day until 7/12, then discontinue antibiotics  Code Status:      Code Status Orders        Start     Ordered   04/23/15 1505  Do not attempt resuscitation (DNR)   Continuous    Question Answer Comment  In the event of cardiac or respiratory ARREST Do not call a "code blue"   In the event of cardiac or respiratory ARREST Do not perform Intubation, CPR, defibrillation or ACLS   In the event of  cardiac or respiratory ARREST Use medication by any route, position, wound care, and other measures to relive pain and suffering. May use oxygen, suction and manual treatment of airway obstruction as needed for comfort.      04/23/15 1504    Advance Directive Documentation        Most Recent Value   Type of Advance Directive  Living will   Pre-existing out of facility DNR order (yellow form or pink MOST form)     "MOST" Form in Place?       Family Communication: family updated about patient's clinical progress Disposition Plan:  As above    Brief narrative: [name] is an 81 y.o. male who was recently transferred to SNF from Hospice for therapy. Today had a good therapy session. Took a nap afterward. When he awakened was noted by his daughter to be starring off in to space. Facial droop was noted. Speech was slurred. EMS was called at that time and the patient was brought in as a code stroke. Initial NIHSS of 18.  Consultants:  Neurology  Procedures:  None  Antibiotics: Anti-infectives    Start     Dose/Rate Route Frequency Ordered Stop   04/22/15 2200  sulfamethoxazole-trimethoprim (BACTRIM DS,SEPTRA DS) 800-160 MG per tablet 1 tablet  Status:  Discontinued     1 tablet Oral Every 12 hours 04/22/15 2046 04/22/15 2047   04/22/15 2200  ceFAZolin (ANCEF) IVPB 1 g/50 mL premix     1 g 100 mL/hr over 30 Minutes Intravenous 3 times per day 04/22/15 2050  HPI/Subjective: Patient is very weak and unable to get out of bed, mother and daughter by the bedside  Objective: Filed Vitals:   04/23/15 0840 04/23/15 1013 04/23/15 1200 04/23/15 1423  BP: 163/97 128/69 131/82 125/69  Pulse:  70 78 70  Temp:  99 F (37.2 C) 99.1 F (37.3 C) 98.6 F (37 C)  TempSrc:  Oral Oral Oral  Resp:  16 16 16   Height:      Weight:      SpO2: 100% 98% 99% 100%   No intake or output data in the 24 hours ending 04/23/15 1506  Exam:  General: No acute respiratory  distress Lungs: Clear to auscultation bilaterally without wheezes or crackles Cardiovascular: Regular rate and rhythm without murmur gallop or rub normal S1 and S2 Abdomen: Nontender, nondistended, soft, bowel sounds positive, no rebound, no ascites, no appreciable mass Extremities: No significant cyanosis, clubbing, or edema bilateral lower extremities     Data Review   Micro Results No results found for this or any previous visit (from the past 240 hour(s)).  Radiology Reports Ct Head Wo Contrast  04/22/2015   CLINICAL DATA:  81 year old male with left-sided weakness. Code stroke.  EXAM: CT HEAD WITHOUT CONTRAST  TECHNIQUE: Contiguous axial images were obtained from the base of the skull through the vertex without intravenous contrast.  COMPARISON:  No priors.  FINDINGS: Moderate cerebral atrophy with extensive ex vacuo dilatation of the ventricular system. Patchy and confluent areas of decreased attenuation are noted throughout the deep and periventricular white matter of the cerebral hemispheres bilaterally, compatible with chronic microvascular ischemic disease. Physiologic calcifications of the basal ganglia bilaterally. No acute intracranial abnormalities. Specifically, no evidence of acute intracranial hemorrhage, no definite findings of acute/subacute cerebral ischemia, no mass, mass effect, hydrocephalus or abnormal intra or extra-axial fluid collections. Visualized paranasal sinuses and mastoids are well pneumatized. No acute displaced skull fractures are identified.  IMPRESSION: 1. No acute intracranial abnormalities. 2. Moderate cerebral atrophy with chronic microvascular ischemic changes in the cerebral white matter, as above. I attempt to call these results to Dr. Doy Mince at 6:47 p.m., however, the page went unanswered. Accordingly, these results were called by telephone at the time of interpretation on 04/22/2015 at 6:58 pm to Dr. Davonna Belling, who verbally acknowledged these  results.   Electronically Signed   By: Vinnie Langton M.D.   On: 04/22/2015 19:00   Mr Brain Wo Contrast  04/23/2015   CLINICAL DATA:  Initial valuation for acute left-sided weakness, left facial droop, rightward gaze. Recent hospitalization for UTI and bacteremia.  EXAM: MRI HEAD WITHOUT CONTRAST  MRA HEAD WITHOUT CONTRAST  TECHNIQUE: Multiplanar, multiecho pulse sequences of the brain and surrounding structures were obtained without intravenous contrast. Angiographic images of the head were obtained using MRA technique without contrast.  COMPARISON:  Prior CT from 04/22/2015  FINDINGS: MRI HEAD FINDINGS  Diffuse prominence of the CSF containing spaces is compatible with generalized age-related cerebral atrophy. Fairly mild chronic small vessel ischemic type changes present within the periventricular white matter. Chronic small vessel type changes present within the central pons as well. Probable small remote lacunar infarcts within the right basal ganglia. Tiny remote lacunar infarct within the left caudate as well.  There is confluent abnormal restricted diffusion involving the right parieto-occipital cortex, compatible with acute right MCA territory infarct (series 3, image 29). There is associated gyral swelling with edema within the area of infarction. Petechial/cortical hemorrhage present within the right parietal lobe on gradient echo sequence (  series 7, image 16). Small serpiginous infarct within the mesial right temporal lobe as well (series 3, image 21). Additional small infarct involves the right caudate (series 3, image 29). These are also consistent with an MCA territory infarction. No significant mass effect or midline shift. Poor flow void within the distal right vertebral artery. Flow voids otherwise maintained.  No mass lesion, midline shift, or mass effect. No extra-axial fluid collection. Ventricular prominence noted, likely related to global parenchymal volume loss. No hydrocephalus.   Craniocervical junction within normal limits. Mild degenerative changes noted within the visualized upper cervical spine. Pituitary gland normal.  No acute abnormality about the orbits. Paranasal sinuses are clear. No mastoid effusion. Inner ear structures grossly normal.  Bone marrow signal intensity within normal limits. No scalp soft tissue abnormality.  MRA HEAD FINDINGS  ANTERIOR CIRCULATION:  Visualized distal cervical segments of the internal carotid arteries are widely patent with antegrade flow. The petrous and cavernous segments arm patent. There is moderate narrowing of the supra clinoid left ICA. Right supra clinoid segment widely patent. Right A1 segment widely patent. Left A1 segment slightly hypoplastic but patent. Anterior communicating artery normal. Anterior cerebral arteries well opacified.  M1 segments patent bilaterally without high-grade stenosis or occlusion. MCA bifurcations within normal limits. No proximal or early branch occlusion identified on the right. On the left, there is attenuation of the distal left MCA branches with possible occlusion of a proximal left M2 branch at the base of the sylvian fissure (series 601, image 9). This is most certainly chronic in nature given the lack of acute infarct in this cerebral hemisphere.  POSTERIOR CIRCULATION:  Left vertebral artery is dominant and widely patent to the vertebrobasilar junction. The diminutive right vertebral artery is not well visualized, and may be occluded or have slow flow. Distal right vertebral artery is visualize beyond the takeoff of the right posterior inferior cerebral artery. Posterior inferior cerebral arteries themselves are grossly patent. Basilar artery widely patent. Possible short segment high-grade stenosis within the proximal right superior cerebellar artery. Left superior cerebellar artery well opacified. Posterior cerebral arteries arise from the basilar artery bilaterally. Right posterior cerebral artery  patent to its distal aspect. There is attenuation and possible occlusion of the distal left PCA. Again, this is likely chronic in nature given the lack of infarct in this territory.  No aneurysm or vascular malformation.  IMPRESSION: MRI HEAD IMPRESSION:  1. Large acute right MCA territory infarct involving the right parieto-occipital and temporal lobes with additional infarct within the right caudate. There is associated petechial/cortical hemorrhage within the right parietal region. 2. Small remote lacunar infarcts involving the bilateral basal ganglia. 3. Moderate age-related cerebral atrophy with mild chronic microvascular ischemic disease.  MRA HEAD IMPRESSION:  1. No acute large vessel or proximal branch occlusion identified within the intracranial circulation. 2. Attenuation of the distal left MCA branches with possible occlusion of a proximal left M2 branch. This is likely chronic in nature given the lack of acute infarct in this vascular territory. 3. Apparent occlusion of the mid left PCA. Again, this is felt to be chronic in nature given the lack of acute infarct in this vascular territory.  4. Poor visualization of the right vertebral artery, which may be due to slow flow versus occlusion proximally within the neck. The dominant left vertebral artery is widely patent as is the basilar artery.   Electronically Signed   By: Jeannine Boga M.D.   On: 04/23/2015 06:11   Dg Chest [facility]  1 View  04/10/2015   CLINICAL DATA:  Fever, altered mental status  EXAM: PORTABLE CHEST - 1 VIEW  COMPARISON:  11/13/2014  FINDINGS: There is no focal parenchymal opacity. There is no pleural effusion or pneumothorax. The heart and mediastinal contours are unremarkable. 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  IMPRESSION: No active disease.   Electronically Signed   By: Kathreen Devoid   On: 04/10/2015 14:14   Mr Jodene Nam Head/brain Wo Cm  04/23/2015   CLINICAL DATA:   Initial valuation for acute left-sided weakness, left facial droop, rightward gaze. Recent hospitalization for UTI and bacteremia.  EXAM: MRI HEAD WITHOUT CONTRAST  MRA HEAD WITHOUT CONTRAST  TECHNIQUE: Multiplanar, multiecho pulse sequences of the brain and surrounding structures were obtained without intravenous contrast. Angiographic images of the head were obtained using MRA technique without contrast.  COMPARISON:  Prior CT from 04/22/2015  FINDINGS: MRI HEAD FINDINGS  Diffuse prominence of the CSF containing spaces is compatible with generalized age-related cerebral atrophy. Fairly mild chronic small vessel ischemic type changes present within the periventricular white matter. Chronic small vessel type changes present within the central pons as well. Probable small remote lacunar infarcts within the right basal ganglia. Tiny remote lacunar infarct within the left caudate as well.  There is confluent abnormal restricted diffusion involving the right parieto-occipital cortex, compatible with acute right MCA territory infarct (series 3, image 29). There is associated gyral swelling with edema within the area of infarction. Petechial/cortical hemorrhage present within the right parietal lobe on gradient echo sequence (series 7, image 16). Small serpiginous infarct within the mesial right temporal lobe as well (series 3, image 21). Additional small infarct involves the right caudate (series 3, image 29). These are also consistent with an MCA territory infarction. No significant mass effect or midline shift. Poor flow void within the distal right vertebral artery. Flow voids otherwise maintained.  No mass lesion, midline shift, or mass effect. No extra-axial fluid collection. Ventricular prominence noted, likely related to global parenchymal volume loss. No hydrocephalus.  Craniocervical junction within normal limits. Mild degenerative changes noted within the visualized upper cervical spine. Pituitary gland normal.   No acute abnormality about the orbits. Paranasal sinuses are clear. No mastoid effusion. Inner ear structures grossly normal.  Bone marrow signal intensity within normal limits. No scalp soft tissue abnormality.  MRA HEAD FINDINGS  ANTERIOR CIRCULATION:  Visualized distal cervical segments of the internal carotid arteries are widely patent with antegrade flow. The petrous and cavernous segments arm patent. There is moderate narrowing of the supra clinoid left ICA. Right supra clinoid segment widely patent. Right A1 segment widely patent. Left A1 segment slightly hypoplastic but patent. Anterior communicating artery normal. Anterior cerebral arteries well opacified.  M1 segments patent bilaterally without high-grade stenosis or occlusion. MCA bifurcations within normal limits. No proximal or early branch occlusion identified on the right. On the left, there is attenuation of the distal left MCA branches with possible occlusion of a proximal left M2 branch at the base of the sylvian fissure (series 601, image 9). This is most certainly chronic in nature given the lack of acute infarct in this cerebral hemisphere.  POSTERIOR CIRCULATION:  Left vertebral artery is dominant and widely patent to the vertebrobasilar junction. The diminutive right vertebral artery is not well visualized, and may be occluded or have slow flow. Distal right vertebral artery is visualize beyond the takeoff of the right posterior inferior cerebral artery. Posterior inferior  cerebral arteries themselves are grossly patent. Basilar artery widely patent. Possible short segment high-grade stenosis within the proximal right superior cerebellar artery. Left superior cerebellar artery well opacified. Posterior cerebral arteries arise from the basilar artery bilaterally. Right posterior cerebral artery patent to its distal aspect. There is attenuation and possible occlusion of the distal left PCA. Again, this is likely chronic in nature given the lack  of infarct in this territory.  No aneurysm or vascular malformation.  IMPRESSION: MRI HEAD IMPRESSION:  1. Large acute right MCA territory infarct involving the right parieto-occipital and temporal lobes with additional infarct within the right caudate. There is associated petechial/cortical hemorrhage within the right parietal region. 2. Small remote lacunar infarcts involving the bilateral basal ganglia. 3. Moderate age-related cerebral atrophy with mild chronic microvascular ischemic disease.  MRA HEAD IMPRESSION:  1. No acute large vessel or proximal branch occlusion identified within the intracranial circulation. 2. Attenuation of the distal left MCA branches with possible occlusion of a proximal left M2 branch. This is likely chronic in nature given the lack of acute infarct in this vascular territory. 3. Apparent occlusion of the mid left PCA. Again, this is felt to be chronic in nature given the lack of acute infarct in this vascular territory.  4. Poor visualization of the right vertebral artery, which may be due to slow flow versus occlusion proximally within the neck. The dominant left vertebral artery is widely patent as is the basilar artery.   Electronically Signed   By: Jeannine Boga M.D.   On: 04/23/2015 06:11     CBC  Recent Labs Lab 04/22/15 1843 04/22/15 1853  WBC 9.5  --   HGB 12.4* 13.9  HCT 36.9* 41.0  PLT 353  --   MCV 87.6  --   MCH 29.5  --   MCHC 33.6  --   RDW 16.2*  --   LYMPHSABS 1.8  --   MONOABS 0.9  --   EOSABS 0.2  --   BASOSABS 0.0  --     Chemistries   Recent Labs Lab 04/22/15 1843 04/22/15 1853 04/23/15 0516  NA 132* 134* 132*  K 5.8* 5.7* 5.1  CL 104 103 106  CO2 22  --  19*  GLUCOSE 165* 165* 114*  BUN 19 23* 16  CREATININE 1.35* 1.30* 1.20  CALCIUM 8.9  --  8.7*  AST 42*  --   --   ALT 27  --   --   ALKPHOS 181*  --   --   BILITOT 0.4  --   --     ------------------------------------------------------------------------------------------------------------------ estimated creatinine clearance is 38.2 mL/min (by C-G formula based on Cr of 1.2). ------------------------------------------------------------------------------------------------------------------ No results for input(s): HGBA1C in the last 72 hours. ------------------------------------------------------------------------------------------------------------------  Recent Labs  04/23/15 0515  CHOL 148  HDL 31*  LDLCALC 98  TRIG 97  CHOLHDL 4.8   ------------------------------------------------------------------------------------------------------------------ No results for input(s): TSH, T4TOTAL, T3FREE, THYROIDAB in the last 72 hours.  Invalid input(s): FREET3 ------------------------------------------------------------------------------------------------------------------ No results for input(s): VITAMINB12, FOLATE, FERRITIN, TIBC, IRON, RETICCTPCT in the last 72 hours.  Coagulation profile  Recent Labs Lab 04/22/15 1843  INR 1.12    No results for input(s): DDIMER in the last 72 hours.  Cardiac Enzymes No results for input(s): CKMB, TROPONINI, MYOGLOBIN in the last 168 hours.  Invalid input(s): CK ------------------------------------------------------------------------------------------------------------------ Invalid input(s): POCBNP   CBG: No results for input(s): GLUCAP in the last 168 hours.     Studies: Ct Head Wo Contrast  04/22/2015   CLINICAL DATA:  81 year old male with left-sided weakness. Code stroke.  EXAM: CT HEAD WITHOUT CONTRAST  TECHNIQUE: Contiguous axial images were obtained from the base of the skull through the vertex without intravenous contrast.  COMPARISON:  No priors.  FINDINGS: Moderate cerebral atrophy with extensive ex vacuo dilatation of the ventricular system. Patchy and confluent areas of decreased attenuation are noted  throughout the deep and periventricular white matter of the cerebral hemispheres bilaterally, compatible with chronic microvascular ischemic disease. Physiologic calcifications of the basal ganglia bilaterally. No acute intracranial abnormalities. Specifically, no evidence of acute intracranial hemorrhage, no definite findings of acute/subacute cerebral ischemia, no mass, mass effect, hydrocephalus or abnormal intra or extra-axial fluid collections. Visualized paranasal sinuses and mastoids are well pneumatized. No acute displaced skull fractures are identified.  IMPRESSION: 1. No acute intracranial abnormalities. 2. Moderate cerebral atrophy with chronic microvascular ischemic changes in the cerebral white matter, as above. I attempt to call these results to Dr. Doy Mince at 6:47 p.m., however, the page went unanswered. Accordingly, these results were called by telephone at the time of interpretation on 04/22/2015 at 6:58 pm to Dr. Davonna Belling, who verbally acknowledged these results.   Electronically Signed   By: Vinnie Langton M.D.   On: 04/22/2015 19:00   Mr Brain Wo Contrast  04/23/2015   CLINICAL DATA:  Initial valuation for acute left-sided weakness, left facial droop, rightward gaze. Recent hospitalization for UTI and bacteremia.  EXAM: MRI HEAD WITHOUT CONTRAST  MRA HEAD WITHOUT CONTRAST  TECHNIQUE: Multiplanar, multiecho pulse sequences of the brain and surrounding structures were obtained without intravenous contrast. Angiographic images of the head were obtained using MRA technique without contrast.  COMPARISON:  Prior CT from 04/22/2015  FINDINGS: MRI HEAD FINDINGS  Diffuse prominence of the CSF containing spaces is compatible with generalized age-related cerebral atrophy. Fairly mild chronic small vessel ischemic type changes present within the periventricular white matter. Chronic small vessel type changes present within the central pons as well. Probable small remote lacunar infarcts within  the right basal ganglia. Tiny remote lacunar infarct within the left caudate as well.  There is confluent abnormal restricted diffusion involving the right parieto-occipital cortex, compatible with acute right MCA territory infarct (series 3, image 29). There is associated gyral swelling with edema within the area of infarction. Petechial/cortical hemorrhage present within the right parietal lobe on gradient echo sequence (series 7, image 16). Small serpiginous infarct within the mesial right temporal lobe as well (series 3, image 21). Additional small infarct involves the right caudate (series 3, image 29). These are also consistent with an MCA territory infarction. No significant mass effect or midline shift. Poor flow void within the distal right vertebral artery. Flow voids otherwise maintained.  No mass lesion, midline shift, or mass effect. No extra-axial fluid collection. Ventricular prominence noted, likely related to global parenchymal volume loss. No hydrocephalus.  Craniocervical junction within normal limits. Mild degenerative changes noted within the visualized upper cervical spine. Pituitary gland normal.  No acute abnormality about the orbits. Paranasal sinuses are clear. No mastoid effusion. Inner ear structures grossly normal.  Bone marrow signal intensity within normal limits. No scalp soft tissue abnormality.  MRA HEAD FINDINGS  ANTERIOR CIRCULATION:  Visualized distal cervical segments of the internal carotid arteries are widely patent with antegrade flow. The petrous and cavernous segments arm patent. There is moderate narrowing of the supra clinoid left ICA. Right supra clinoid segment widely patent. Right A1 segment widely patent. Left A1 segment slightly  hypoplastic but patent. Anterior communicating artery normal. Anterior cerebral arteries well opacified.  M1 segments patent bilaterally without high-grade stenosis or occlusion. MCA bifurcations within normal limits. No proximal or early  branch occlusion identified on the right. On the left, there is attenuation of the distal left MCA branches with possible occlusion of a proximal left M2 branch at the base of the sylvian fissure (series 601, image 9). This is most certainly chronic in nature given the lack of acute infarct in this cerebral hemisphere.  POSTERIOR CIRCULATION:  Left vertebral artery is dominant and widely patent to the vertebrobasilar junction. The diminutive right vertebral artery is not well visualized, and may be occluded or have slow flow. Distal right vertebral artery is visualize beyond the takeoff of the right posterior inferior cerebral artery. Posterior inferior cerebral arteries themselves are grossly patent. Basilar artery widely patent. Possible short segment high-grade stenosis within the proximal right superior cerebellar artery. Left superior cerebellar artery well opacified. Posterior cerebral arteries arise from the basilar artery bilaterally. Right posterior cerebral artery patent to its distal aspect. There is attenuation and possible occlusion of the distal left PCA. Again, this is likely chronic in nature given the lack of infarct in this territory.  No aneurysm or vascular malformation.  IMPRESSION: MRI HEAD IMPRESSION:  1. Large acute right MCA territory infarct involving the right parieto-occipital and temporal lobes with additional infarct within the right caudate. There is associated petechial/cortical hemorrhage within the right parietal region. 2. Small remote lacunar infarcts involving the bilateral basal ganglia. 3. Moderate age-related cerebral atrophy with mild chronic microvascular ischemic disease.  MRA HEAD IMPRESSION:  1. No acute large vessel or proximal branch occlusion identified within the intracranial circulation. 2. Attenuation of the distal left MCA branches with possible occlusion of a proximal left M2 branch. This is likely chronic in nature given the lack of acute infarct in this vascular  territory. 3. Apparent occlusion of the mid left PCA. Again, this is felt to be chronic in nature given the lack of acute infarct in this vascular territory.  4. Poor visualization of the right vertebral artery, which may be due to slow flow versus occlusion proximally within the neck. The dominant left vertebral artery is widely patent as is the basilar artery.   Electronically Signed   By: Jeannine Boga M.D.   On: 04/23/2015 06:11   Mr Jodene Nam Head/brain Wo Cm  04/23/2015   CLINICAL DATA:  Initial valuation for acute left-sided weakness, left facial droop, rightward gaze. Recent hospitalization for UTI and bacteremia.  EXAM: MRI HEAD WITHOUT CONTRAST  MRA HEAD WITHOUT CONTRAST  TECHNIQUE: Multiplanar, multiecho pulse sequences of the brain and surrounding structures were obtained without intravenous contrast. Angiographic images of the head were obtained using MRA technique without contrast.  COMPARISON:  Prior CT from 04/22/2015  FINDINGS: MRI HEAD FINDINGS  Diffuse prominence of the CSF containing spaces is compatible with generalized age-related cerebral atrophy. Fairly mild chronic small vessel ischemic type changes present within the periventricular white matter. Chronic small vessel type changes present within the central pons as well. Probable small remote lacunar infarcts within the right basal ganglia. Tiny remote lacunar infarct within the left caudate as well.  There is confluent abnormal restricted diffusion involving the right parieto-occipital cortex, compatible with acute right MCA territory infarct (series 3, image 29). There is associated gyral swelling with edema within the area of infarction. Petechial/cortical hemorrhage present within the right parietal lobe on gradient echo sequence (series 7, image 16).  Small serpiginous infarct within the mesial right temporal lobe as well (series 3, image 21). Additional small infarct involves the right caudate (series 3, image 29). These are also  consistent with an MCA territory infarction. No significant mass effect or midline shift. Poor flow void within the distal right vertebral artery. Flow voids otherwise maintained.  No mass lesion, midline shift, or mass effect. No extra-axial fluid collection. Ventricular prominence noted, likely related to global parenchymal volume loss. No hydrocephalus.  Craniocervical junction within normal limits. Mild degenerative changes noted within the visualized upper cervical spine. Pituitary gland normal.  No acute abnormality about the orbits. Paranasal sinuses are clear. No mastoid effusion. Inner ear structures grossly normal.  Bone marrow signal intensity within normal limits. No scalp soft tissue abnormality.  MRA HEAD FINDINGS  ANTERIOR CIRCULATION:  Visualized distal cervical segments of the internal carotid arteries are widely patent with antegrade flow. The petrous and cavernous segments arm patent. There is moderate narrowing of the supra clinoid left ICA. Right supra clinoid segment widely patent. Right A1 segment widely patent. Left A1 segment slightly hypoplastic but patent. Anterior communicating artery normal. Anterior cerebral arteries well opacified.  M1 segments patent bilaterally without high-grade stenosis or occlusion. MCA bifurcations within normal limits. No proximal or early branch occlusion identified on the right. On the left, there is attenuation of the distal left MCA branches with possible occlusion of a proximal left M2 branch at the base of the sylvian fissure (series 601, image 9). This is most certainly chronic in nature given the lack of acute infarct in this cerebral hemisphere.  POSTERIOR CIRCULATION:  Left vertebral artery is dominant and widely patent to the vertebrobasilar junction. The diminutive right vertebral artery is not well visualized, and may be occluded or have slow flow. Distal right vertebral artery is visualize beyond the takeoff of the right posterior inferior cerebral  artery. Posterior inferior cerebral arteries themselves are grossly patent. Basilar artery widely patent. Possible short segment high-grade stenosis within the proximal right superior cerebellar artery. Left superior cerebellar artery well opacified. Posterior cerebral arteries arise from the basilar artery bilaterally. Right posterior cerebral artery patent to its distal aspect. There is attenuation and possible occlusion of the distal left PCA. Again, this is likely chronic in nature given the lack of infarct in this territory.  No aneurysm or vascular malformation.  IMPRESSION: MRI HEAD IMPRESSION:  1. Large acute right MCA territory infarct involving the right parieto-occipital and temporal lobes with additional infarct within the right caudate. There is associated petechial/cortical hemorrhage within the right parietal region. 2. Small remote lacunar infarcts involving the bilateral basal ganglia. 3. Moderate age-related cerebral atrophy with mild chronic microvascular ischemic disease.  MRA HEAD IMPRESSION:  1. No acute large vessel or proximal branch occlusion identified within the intracranial circulation. 2. Attenuation of the distal left MCA branches with possible occlusion of a proximal left M2 branch. This is likely chronic in nature given the lack of acute infarct in this vascular territory. 3. Apparent occlusion of the mid left PCA. Again, this is felt to be chronic in nature given the lack of acute infarct in this vascular territory.  4. Poor visualization of the right vertebral artery, which may be due to slow flow versus occlusion proximally within the neck. The dominant left vertebral artery is widely patent as is the basilar artery.   Electronically Signed   By: Jeannine Boga M.D.   On: 04/23/2015 06:11      No results found for:  HGBA1C Lab Results  Component Value Date   LDLCALC 98 04/23/2015   CREATININE 1.20 04/23/2015       Scheduled Meds: . aspirin  300 mg Rectal Daily    Or  . aspirin  325 mg Oral Daily  .  ceFAZolin (ANCEF) IV  1 g Intravenous 3 times per day  . feeding supplement (ENSURE ENLIVE)  237 mL Oral BID BM  . [START ON 04/24/2015] feeding supplement (PRO-STAT SUGAR FREE 64)  30 mL Oral Daily  . heparin  5,000 Units Subcutaneous 3 times per day  . multivitamin with minerals  1 tablet Oral Daily   Continuous Infusions: . sodium chloride 75 mL/hr at 04/22/15 2330    Principal Problem:   Stroke Active Problems:   Essential hypertension   Bacteremia due to Klebsiella pneumoniae   Hyperkalemia   A-fib    Time spent: 45 minutes   [facility] Pager [phone]. If 7PM-7AM, please contact night-coverage at [email or web address], password [facility] 04/23/2015, 3:06 PM  LOS: 1 day

## 2015-04-24 DIAGNOSIS — R112 Nausea with vomiting, unspecified: Secondary | ICD-10-CM | POA: Diagnosis present

## 2015-04-24 DIAGNOSIS — I350 Nonrheumatic aortic (valve) stenosis: Secondary | ICD-10-CM

## 2015-04-24 LAB — HEMOGLOBIN A1C
Hgb A1c MFr Bld: 6.7 % — ABNORMAL HIGH (ref 4.8–5.6)
Mean Plasma Glucose: 146 mg/dL

## 2015-04-24 MED ORDER — ONDANSETRON HCL 4 MG/2ML IJ SOLN
4.0000 mg | Freq: Four times a day (QID) | INTRAMUSCULAR | Status: DC | PRN
  Administered 2015-04-24 (×2): 4 mg via INTRAVENOUS
  Filled 2015-04-24 (×2): qty 2

## 2015-04-24 MED ORDER — ALUM & MAG HYDROXIDE-SIMETH 200-200-20 MG/5ML PO SUSP
30.0000 mL | Freq: Four times a day (QID) | ORAL | Status: DC | PRN
  Administered 2015-04-24: 30 mL via ORAL
  Filled 2015-04-24: qty 30

## 2015-04-24 NOTE — Progress Notes (Addendum)
Speech Language Pathology Treatment: Dysphagia;Cognitive-Linquistic  Patient Details Name: ADAL SERENO MRN: [id number] DOB: Dec 15, 1925 Today's Date: 04/24/2015 Time: 3833-3832 SLP Time Calculation (min) (ACUTE ONLY): 33 min  Assessment / Plan / Recommendation Clinical Impression  Pt today answered approximately 25% of SLP direct questions with cues required.  Continues with right gaze preference but with total cues *visual, verbal, tactile* he will attend to the left.    SLP assisted pt with breakfast, consumption of french toast, pear, coffee and orange juice.  Liquids consumed via straw/cup = delayed multiple swallows noted but no s/s of aspiration.  Slow mastication of solids noted with pt expectorating french toast rather than swallowing. He did eventually clear masticated pear with milk liquid wash.  Applesauce provided to assess if would aid oral transit however significant gag response noted followed by vomiting large amount of green tinged emesis. HOB elevated and basin provided for pt.   RN made aware and arrived to help care for pt - along with nurse tech.  Mild amount of coughing noted with vomiting, ? Minimal aspiration.    Pt's dysphagia may prohibit him from consuming adequate nutrition/hydration.  Daughter had reported to SlP yesterday that pt does not consume enough water and was recently admitted to [facility] for sepsis from UTI.    To maximize swallow efficiency and safety, recommend modify to full liquids. MD made aware and agreeable. Educated pt to plan and he reports being agreeable. Thanks.  SLP to follow for family education/dysphagia tx.   Posted sign in room to cue pt to attend to left.     HPI Other Pertinent Information: 81 yo male adm to [facility] with left droop, right gaze preference.  Pt found to have right MCA CVA.  h/o GERD w/ Esophageal stricture and dilitations(x4 per pt - records indicate x3), sepsis due to UTI prompting admission to [place] and dc to [facility]   Swallow and speech evaluation ordered.     Pertinent Vitals Pain Assessment: No/denies pain  SLP Plan  Continue with current plan of care    Recommendations Diet recommendations: Thin liquid (? change to full liquids ? defer to MD) Liquids provided via: Cup;Straw Medication Administration: Whole meds with puree (with yogurt, crush if large and not contraindicated or via alternative means) Supervision: Staff to assist with self feeding;Full supervision/cueing for compensatory strategies Compensations: Slow rate;Small sips/bites (consume liquids t/o meal, check for oral residuals) Postural Changes and/or Swallow Maneuvers: Seated upright 90 degrees;Upright 30-60 min after meal              Oral Care Recommendations: Oral care BID;Oral care before and after PO Follow up Recommendations: Skilled Nursing facility Plan: Continue with current plan of care    [place], [place], [facility] SLP [phone]

## 2015-04-24 NOTE — Evaluation (Signed)
Physical Therapy Evaluation Patient Details Name: [name] MRN: [id number] DOB: 1926-09-13 Today's Date: 04/24/2015   History of Present Illness  81 y.o. male who was recently transferred to SNF from [facility] hospital, after an admission for UTI.Discharged to SNF for rehab. On 04/22/15 after waking up from a nap later was noted to have L sided weakness, L facial droop, R gaze with stare.  Clinical Impression  Pt admitted with the above diagnosis. Pt currently with functional limitations due to the deficits listed below (see PT Problem List). Mildly nauseous throughout therapy session (-) emesis. Tolerated sitting edge of bed x5 minutes focusing on postural control with notable loss of balance towards left and posterior. Left sided neglect present but able to slowly correct during tasks with verbal cues and encouragement.  Pt will benefit from skilled PT to increase their independence and safety with mobility to allow discharge to the venue listed below.       Follow Up Recommendations SNF    Equipment Recommendations  None recommended by PT    Recommendations for Other Services       Precautions / Restrictions Precautions Precautions: Fall Restrictions Weight Bearing Restrictions: No      Mobility  Bed Mobility Overal bed mobility: Needs Assistance Bed Mobility: Rolling;Sidelying to Sit;Sit to Supine Rolling: Mod assist Sidelying to sit: Max assist;HOB elevated   Sit to supine: HOB elevated;Mod assist   General bed mobility comments: Mod assist to roll onto side with cues for technique and hand placement on rail for assist. Performed to pts left side which was slow due to neglect. Difficulty bringing LEs past midline without assistance. VC for technique throughout. Max assist for truncal support when transitioning to seated position. Was able to bring legs into bed with guidance.  Transfers                 General transfer comment: Not attempted due to poorly  sustained postural control.  Ambulation/Gait                Stairs            Wheelchair Mobility    Modified Rankin (Stroke Patients Only) Modified Rankin (Stroke Patients Only) Pre-Morbid Rankin Score: Moderately severe disability (Per pt report (no family to confirm available)) Modified Rankin: Severe disability     Balance Overall balance assessment: Needs assistance Sitting-balance support: Single extremity supported Sitting balance-Leahy Scale: Poor Sitting balance - Comments: Sat EOB x5 minutes requiring frequent assist for posture due to LOB to left and posteriorly. practiced weight bearing through Rt elbow, return to midline, and static midline hold. Began to lean posterior as pt fatigued.                                     Pertinent Vitals/Pain Pain Assessment: No/denies pain    Home Living Family/patient expects to be discharged to:: [place]:  (From SNF- was receiving hospice at home) Available Help at Discharge: [place] Type of Home: [place] Equipment: Gilford Rile - 2 wheels Additional Comments: Some information taken from previous notes.    Prior Function Level of Independence: Needs assistance         Comments: Pt coming from SNF, poor historian, no family present to clarify PLOF     Hand Dominance   Dominant Hand: Right  Extremity/Trunk Assessment   Upper Extremity Assessment: Defer to OT evaluation RUE Deficits / Details: 3-/5 MMT; able to bring hand to mouth but not to top of head     LUE Deficits / Details: Pt with L neglect; 3-/5MMT, unable to bring hand to mouth; additional time required to extend fingers   Lower Extremity Assessment: Difficult to assess due to impaired cognition      Cervical / Trunk Assessment: Kyphotic  Communication   Communication: No difficulties  Cognition Arousal/Alertness: Awake/alert Behavior  During Therapy: Flat affect Overall Cognitive Status: No family/caregiver present to determine baseline cognitive functioning (delayed response; oriented to person)                      General Comments      Exercises        Assessment/Plan    PT Assessment Patient needs continued PT services  PT Diagnosis Difficulty walking;Generalized weakness;Altered mental status   PT Problem List Decreased strength;Decreased range of motion;Decreased activity tolerance;Decreased balance;Decreased cognition;Decreased mobility;Decreased knowledge of use of DME;Decreased skin integrity  PT Treatment Interventions DME instruction;Functional mobility training;Therapeutic activities;Gait training;Therapeutic exercise;Balance training;Neuromuscular re-education;Cognitive remediation;Patient/family education   PT Goals (Current goals can be found in the Care Plan section) Acute Rehab PT Goals Patient Stated Goal: none stated PT Goal Formulation: With patient Time For Goal Achievement: 05/08/15 Potential to Achieve Goals: Fair    Frequency Min 2X/week   Barriers to discharge        Co-evaluation               End of Session   Activity Tolerance: Patient limited by fatigue Patient left: in bed;with call bell/phone within reach;with bed alarm set Nurse Communication: Mobility status         Time: 7782-4235 PT Time Calculation (min) (ACUTE ONLY): 19 min   Charges:   PT Evaluation $Initial PT Evaluation Tier I: 1 Procedure     PT G CodesEllouise Newer 04/24/2015, 4:11 PM Elayne Snare, [place]

## 2015-04-24 NOTE — Progress Notes (Signed)
Paged MD regarding pt having a 7 beat run of V Tach

## 2015-04-24 NOTE — Clinical Social Work Note (Signed)
CSW spoke with Caryl Pina from [facility] to confirmed bed offer. Caryl Pina confirmed that they can accept the pt. CSW will continue to provided support and assist with discharge needs.   [place], MSW, [place]

## 2015-04-24 NOTE — Progress Notes (Signed)
STROKE TEAM PROGRESS NOTE   HISTORY [name] is an 81 y.o. male who was recently transferred to SNF from Hospice for therapy. Today had a good therapy session. Took a nap afterward. When he awakened was noted by his daughter to be starring off in to space. Facial droop was noted. Speech was slurred. EMS was called at that time and the patient was brought in as a code stroke. Initial NIHSS of 18.   Date last known well: Date: 04/22/2015 Time last known well: Time: 14:30 tPA Given: No: Outside time window  Modified Rankin: Rankin Score=4   SUBJECTIVE (INTERVAL HISTORY) His daughter is at the bedside.  Overall he feels his condition is improving. . Daughter with good understanding of disease and care. Prefers quality over quantity of life for her dad.  Patient has recently hospitalized at nursing home with hospice following hospital admission for sepsis   OBJECTIVE Temp:  [98 F (36.7 C)-99.5 F (37.5 C)] 99.5 F (37.5 C) (07/13 0947) Pulse Rate:  [71-90] 90 (07/13 0947) Cardiac Rhythm:  [-]  Resp:  [16-18] 18 (07/13 0947) BP: (106-151)/(61-97) 121/97 mmHg (07/13 0947) SpO2:  [97 %-99 %] 99 % (07/13 0947)  No results for input(s): GLUCAP in the last 168 hours.  Recent Labs Lab 04/22/15 1843 04/22/15 1853 04/23/15 0516  NA 132* 134* 132*  K 5.8* 5.7* 5.1  CL 104 103 106  CO2 22  --  19*  GLUCOSE 165* 165* 114*  BUN 19 23* 16  CREATININE 1.35* 1.30* 1.20  CALCIUM 8.9  --  8.7*    Recent Labs Lab 04/22/15 1843  AST 42*  ALT 27  ALKPHOS 181*  BILITOT 0.4  PROT 5.9*  ALBUMIN 2.5*    Recent Labs Lab 04/22/15 1843 04/22/15 1853  WBC 9.5  --   NEUTROABS 6.6  --   HGB 12.4* 13.9  HCT 36.9* 41.0  MCV 87.6  --   PLT 353  --    No results for input(s): CKTOTAL, CKMB, CKMBINDEX, TROPONINI in the last 168 hours.  Recent Labs  04/22/15 1843  LABPROT 14.6  INR 1.12    Recent Labs  04/23/15 0420  COLORURINE YELLOW  LABSPEC 1.008  PHURINE 7.0   GLUCOSEU NEGATIVE  HGBUR NEGATIVE  BILIRUBINUR NEGATIVE  KETONESUR NEGATIVE  PROTEINUR NEGATIVE  UROBILINOGEN 0.2  NITRITE NEGATIVE  LEUKOCYTESUR NEGATIVE       Component Value Date/Time   CHOL 148 04/23/2015 0515   TRIG 97 04/23/2015 0515   HDL 31* 04/23/2015 0515   CHOLHDL 4.8 04/23/2015 0515   VLDL 19 04/23/2015 0515   LDLCALC 98 04/23/2015 0515   No results found for: HGBA1C    Component Value Date/Time   LABOPIA NONE DETECTED 04/23/2015 0420   COCAINSCRNUR NONE DETECTED 04/23/2015 0420   LABBENZ NONE DETECTED 04/23/2015 0420   AMPHETMU NONE DETECTED 04/23/2015 0420   THCU NONE DETECTED 04/23/2015 0420   LABBARB NONE DETECTED 04/23/2015 0420     Recent Labs Lab 04/22/15 1843  ETH <5    Ct Head Wo Contrast 04/22/2015    1. No acute intracranial abnormalities. 2. Moderate cerebral atrophy with chronic microvascular ischemic changes in the cerebral white matter  MRI HEAD  04/23/2015    IMPRESSION:  1. Large acute right MCA territory infarct involving the right parieto-occipital and temporal lobes with additional infarct within the right caudate. There is associated petechial/cortical hemorrhage within the right parietal region. 2. Small remote lacunar infarcts involving the bilateral basal ganglia.  3. Moderate age-related cerebral atrophy with mild chronic microvascular ischemic disease.    MRA HEAD  04/23/2015    IMPRESSION:  1. No acute large vessel or proximal branch occlusion identified within the intracranial circulation. 2. Attenuation of the distal left MCA branches with possible occlusion of a proximal left M2 branch. This is likely chronic in nature given the lack of acute infarct in this vascular territory. 3. Apparent occlusion of the mid left PCA. Again, this is felt to be chronic in nature given the lack of acute infarct in this vascular territory.  4. Poor visualization of the right vertebral artery, which may be due to slow flow versus occlusion proximally  within the neck. The dominant left vertebral artery is widely patent as is the basilar artery.       PHYSICAL EXAM Frail elderly caucasian male not in distress. . Afebrile. Head is nontraumatic. Neck is supple without bruit.    Cardiac exam no murmur or gallop. Lungs are clear to auscultation. Distal pulses are well felt. Neurological Exam :  Awake alert oriented 2. Diminished attention, registration and recall. Speech is hypophonic but can be understood with some difficulty. No aphasia or dysarthria. Extraocular movements are full range. Left homonymous hemianopsia. Pupils irregular but equal and reactive. Fundi were not visualized. Mild left lower facial weakness. Tongue midline. Mild left hemiparesis 4/5 strength with significant weakness of left grip and intrinsic hand muscles. Mild weakness of left hip and ankle dorsiflexors. Sensation is intact. Deep tendon reflexes are symmetric. Plantars are downgoing. Gait was not tested. ASSESSMENT/PLAN Mr. [name] is a 81 y.o. male with history of end-stage CHF on home hospice with recent hospital admission for UTI and bacteremia, d/c to SNF for rehab prior to return home. At SNF, developed left sided weakness and was brought in as a code stroke. He did not receive IV t-PA due to delay in arrival.   Stroke:  Non-dominant large right MCA and small R caudate infarct embolic secondary to recently diagnosed atrial fibrillation   Resultant  Left hemiparesis  MRI  Large right MCA infarct (right parieto-occipital and temporal lobes) and right caudate infarct. petechial/cortical hemorrhage within the right parietal region  MRA  No large vessel occlusion. L M2 branch occlusion. Mid L PCA occlusion.  Carotid Doppler  1-39% ICA stenosis. Vertebral artery flow is antegrade.   2D Echo  Left ventricle: Posterior basal akinesis. The cavity size was moderately dilated. Wall thickness was increased in a pattern of severe LVH. There was severe concentric  hypertrophy. Systolic function was normal. The estimated ejection fraction was in the range of 50% to 55%.LDL 98  HgbA1c pending  Heparin 5000 units sq tid for VTE prophylaxis Diet full liquid Room service appropriate?: No; Fluid consistency:: Thin  no antithrombotic prior to admission, now on aspirin 325 mg orally every day  Therapy recommendations:  pending   Disposition:  Pending. Anticipate need to return to SNF   Atrial Fibrillation  apparently a new dx during recent hospitalization at [facility] anticoagulation:  none  CHA2DS2-VASc Score = 8, ?2 oral anticoagulation recommended  Age in Years:  ?87   +2    Sex:  Male   0    Hypertension History:  yes   +1     Diabetes Mellitus:  yes   +1   Congestive Heart Failure History:  yes   +1  Vascular Disease History:  yes   +1     Stroke/TIA/Thromboembolism History:  yes   +  2  Dr. Leonie Man discussed anticoagulation options with daughter. Given pts frail, unsteady state and generalized overall poor medical condidtion, decision made not to place pt on anticoagulation. Will continue aspirin at discharge  Hypertension Permissive hypertension (OK if < 220/120) but gradually normalize in 5-7 days   Stable  Hyperlipidemia  Home meds:  No statin  LDL 98, goal < 70  Given frail state and pt/family desire for quality of quantity, will not add statin at this time  Diabetes  HgbA1c pending, goal < 7.0  Other Stroke Risk Factors  Advanced age  Coronary artery disease  Other Active Problems  Bacteremia d/t K Pneumo. On abx  Hyperkalemia  Hospital day # [place] for Pager information 04/24/2015 3:13 PM  I have personally examined this patient, reviewed notes, independently viewed imaging studies, participated in medical decision making and plan of care. I have made any additions or clarifications directly to the above note. Agree with note above. Patient has presented with  embolic right MCA branch infarct and remains at risk for neurological worsening, recurrent stroke, TIA and needs ongoing stroke evaluation and aggressive risk factor modification. He has new-onset A. fib but after long discussion with the daughter at the bedside patient is not a good long-term anticoagulation candidate even history of falls and gait difficulties and frail body habitus . Agree with continuing aspirin rather than anticoagulation and transferred to nursing home over the next couple of days. Stroke team will sign off kindly call for questions. Antony Contras, MD Medical Director [facility] Pager: [phone] 04/24/2015 3:13 PM    To contact Stroke Continuity provider, please refer to [email or web address] After hours, contact General Neurology

## 2015-04-24 NOTE — Care Management Important Message (Signed)
Important Message  Patient Details  Name: [name] MRN: [id number] Date of Birth: May 03, 1926   Medicare Important Message Given:  Yes-third notification given    [name] 04/24/2015, 11:41 AM

## 2015-04-24 NOTE — Progress Notes (Signed)
[facility] PROGRESS NOTE  [name] RCB:[id number] DOB: [birth year] DOA: 04/22/2015 PCP: Idelle Crouch, MD  Assessment/Plan: Principal Problem:   Stroke Active Problems:   Essential hypertension   Aortic valve stenosis   Bacteremia due to Klebsiella pneumoniae   Hyperkalemia   A-fib    Stroke: Non-dominant large right MCA and small R caudate infarct embolic secondary to recently diagnosed atrial fibrillation ,Resultant Left hemiparesis, MRI Large right MCA infarct (right parieto-occipital and temporal lobes) and right caudate infarct. petechial/cortical hemorrhage within the right parietal region, MRA No large vessel occlusion. L M2 branch occlusion. Mid L PCA occlusion. Carotid Doppler  Without critical stenosis 2D Echo without source of embolus, progression of AS, now severe ,LDL 98, HgbA1c still pending ,  DIET DYS 3  Dys. 3 diet w/ thin liquids    now on aspirin 325 mg orally every day  Therapy recommendations:SNF  Disposition:Monitor today as patient has been vomiting. Hopefully skilled nursing facility tomorrow.  Atrial Fibrillation currently not on anticoagulation,CHA2DS2-VASc Score = 8, neurology,Dr. Leonie Man discussed anticoagulation options with daughter. Given pts frail, unsteady state and generalized overall poor medical condidtion, decision made not to place pt on anticoagulation. Continue aspirin  Hypertension-resume Coreg at discharge   Hyperlipidemia, Home meds: No statin,LDL 98, goal < 70,Given frail state and pt/family desire for quality of quantity, will not add statin at this time  Diabetes,HgbA1c pending, goal < 7.0    Vomiting: When necessary Zofran. Minimize nonessential oral medications.  Has acute on chronic dysphagia  Recent bacteremia, has been refusing Bactrim. Near the end of treatment. Will DC.  Discussed prognosis with patient's daughter. The plan is to go to skilled nursing facility for an attempt at rehabilitation  from stroke. Should he continue to decline, would likely transition to hospice/comfort care. She agrees.  Code Status:      Code Status Orders        Start     Ordered   04/23/15 1505  Do not attempt resuscitation (DNR)   Continuous    Question Answer Comment  In the event of cardiac or respiratory ARREST Do not call a "code blue"   In the event of cardiac or respiratory ARREST Do not perform Intubation, CPR, defibrillation or ACLS   In the event of cardiac or respiratory ARREST Use medication by any route, position, wound care, and other measures to relive pain and suffering. May use oxygen, suction and manual treatment of airway obstruction as needed for comfort.      04/23/15 1504    Advance Directive Documentation        Most Recent Value   Type of Advance Directive  Living will   Pre-existing out of facility DNR order (yellow form or pink MOST form)     "MOST" Form in Place?       Family Communication: family updated about patient's clinical progress Disposition Plan:  As above    Brief narrative: [name] is an 81 y.o. male who was recently transferred to SNF from Hospice for therapy. Today had a good therapy session. Took a nap afterward. When he awakened was noted by his daughter to be starring off in to space. Facial droop was noted. Speech was slurred. EMS was called at that time and the patient was brought in as a code stroke. Initial NIHSS of 18.  Consultants:  Neurology  Procedures:  None  Antibiotics: Anti-infectives    Start     Dose/Rate Route Frequency  Ordered Stop   04/23/15 1530  sulfamethoxazole-trimethoprim (BACTRIM DS,SEPTRA DS) 800-160 MG per tablet 1 tablet  Status:  Discontinued     1 tablet Oral Every 12 hours 04/23/15 1514 04/23/15 1525   04/23/15 1530  sulfamethoxazole-trimethoprim (BACTRIM,SEPTRA) 200-40 MG/5ML suspension 20 mL  Status:  Discontinued     20 mL Oral Every 12 hours 04/23/15 1525 04/24/15 0915   04/22/15 2200   sulfamethoxazole-trimethoprim (BACTRIM DS,SEPTRA DS) 800-160 MG per tablet 1 tablet  Status:  Discontinued     1 tablet Oral Every 12 hours 04/22/15 2046 04/22/15 2047   04/22/15 2200  ceFAZolin (ANCEF) IVPB 1 g/50 mL premix  Status:  Discontinued     1 g 100 mL/hr over 30 Minutes Intravenous 3 times per day 04/22/15 2050 04/23/15 1514         HPI/Subjective: Nauseated earlier this morning. Improved after Zofran. Vomited once, bilious emesis. Speech therapist thinks it may have been simulation of gag reflex when she was testing different consistencies. Per nursing staff, not eating much, not taking medications consistently. Currently taking gravy by mouth with assistance from daughter.  Objective: Filed Vitals:   04/23/15 2145 04/24/15 0138 04/24/15 0542 04/24/15 0947  BP: 106/84 151/72 135/75 121/97  Pulse: 71 77 80 90  Temp: 98.3 F (36.8 C) 98 F (36.7 C) 98.5 F (36.9 C) 99.5 F (37.5 C)  TempSrc: Oral Oral Oral Oral  Resp: 16 16 16 18   Height:      Weight:      SpO2: 97% 99% 99% 99%    Intake/Output Summary (Last 24 hours) at 04/24/15 1107 Last data filed at 04/23/15 2036  Gross per 24 hour  Intake      0 ml  Output    100 ml  Net   -100 ml    Exam:  General: Frail. Coughing occasionally. Answers questions, but delayed. Lungs: Clear to auscultation bilaterally without wheezes or crackles Cardiovascular: Regular rate and rhythm without murmur gallop or rub normal S1 and S2 Abdomen: Nontender, nondistended, soft, bowel sounds positive, no rebound, no ascites, no appreciable mass Extremities: No significant cyanosis, clubbing, or edema bilateral lower extremities Neurologic: Speech hesitant. Left arm strength 0 out of 5. Left leg strength 4 out of 5.  Data Review   Micro Results No results found for this or any previous visit (from the past 240 hour(s)).  Radiology Reports Ct Head Wo Contrast  04/22/2015   CLINICAL DATA:  81 year old male with left-sided  weakness. Code stroke.  EXAM: CT HEAD WITHOUT CONTRAST  TECHNIQUE: Contiguous axial images were obtained from the base of the skull through the vertex without intravenous contrast.  COMPARISON:  No priors.  FINDINGS: Moderate cerebral atrophy with extensive ex vacuo dilatation of the ventricular system. Patchy and confluent areas of decreased attenuation are noted throughout the deep and periventricular white matter of the cerebral hemispheres bilaterally, compatible with chronic microvascular ischemic disease. Physiologic calcifications of the basal ganglia bilaterally. No acute intracranial abnormalities. Specifically, no evidence of acute intracranial hemorrhage, no definite findings of acute/subacute cerebral ischemia, no mass, mass effect, hydrocephalus or abnormal intra or extra-axial fluid collections. Visualized paranasal sinuses and mastoids are well pneumatized. No acute displaced skull fractures are identified.  IMPRESSION: 1. No acute intracranial abnormalities. 2. Moderate cerebral atrophy with chronic microvascular ischemic changes in the cerebral white matter, as above. I attempt to call these results to Dr. Doy Mince at 6:47 p.m., however, the page went unanswered. Accordingly, these results were called by telephone at  the time of interpretation on 04/22/2015 at 6:58 pm to Dr. Davonna Belling, who verbally acknowledged these results.   Electronically Signed   By: Vinnie Langton M.D.   On: 04/22/2015 19:00   Mr Brain Wo Contrast  04/23/2015   CLINICAL DATA:  Initial valuation for acute left-sided weakness, left facial droop, rightward gaze. Recent hospitalization for UTI and bacteremia.  EXAM: MRI HEAD WITHOUT CONTRAST  MRA HEAD WITHOUT CONTRAST  TECHNIQUE: Multiplanar, multiecho pulse sequences of the brain and surrounding structures were obtained without intravenous contrast. Angiographic images of the head were obtained using MRA technique without contrast.  COMPARISON:  Prior CT from  04/22/2015  FINDINGS: MRI HEAD FINDINGS  Diffuse prominence of the CSF containing spaces is compatible with generalized age-related cerebral atrophy. Fairly mild chronic small vessel ischemic type changes present within the periventricular white matter. Chronic small vessel type changes present within the central pons as well. Probable small remote lacunar infarcts within the right basal ganglia. Tiny remote lacunar infarct within the left caudate as well.  There is confluent abnormal restricted diffusion involving the right parieto-occipital cortex, compatible with acute right MCA territory infarct (series 3, image 29). There is associated gyral swelling with edema within the area of infarction. Petechial/cortical hemorrhage present within the right parietal lobe on gradient echo sequence (series 7, image 16). Small serpiginous infarct within the mesial right temporal lobe as well (series 3, image 21). Additional small infarct involves the right caudate (series 3, image 29). These are also consistent with an MCA territory infarction. No significant mass effect or midline shift. Poor flow void within the distal right vertebral artery. Flow voids otherwise maintained.  No mass lesion, midline shift, or mass effect. No extra-axial fluid collection. Ventricular prominence noted, likely related to global parenchymal volume loss. No hydrocephalus.  Craniocervical junction within normal limits. Mild degenerative changes noted within the visualized upper cervical spine. Pituitary gland normal.  No acute abnormality about the orbits. Paranasal sinuses are clear. No mastoid effusion. Inner ear structures grossly normal.  Bone marrow signal intensity within normal limits. No scalp soft tissue abnormality.  MRA HEAD FINDINGS  ANTERIOR CIRCULATION:  Visualized distal cervical segments of the internal carotid arteries are widely patent with antegrade flow. The petrous and cavernous segments arm patent. There is moderate  narrowing of the supra clinoid left ICA. Right supra clinoid segment widely patent. Right A1 segment widely patent. Left A1 segment slightly hypoplastic but patent. Anterior communicating artery normal. Anterior cerebral arteries well opacified.  M1 segments patent bilaterally without high-grade stenosis or occlusion. MCA bifurcations within normal limits. No proximal or early branch occlusion identified on the right. On the left, there is attenuation of the distal left MCA branches with possible occlusion of a proximal left M2 branch at the base of the sylvian fissure (series 601, image 9). This is most certainly chronic in nature given the lack of acute infarct in this cerebral hemisphere.  POSTERIOR CIRCULATION:  Left vertebral artery is dominant and widely patent to the vertebrobasilar junction. The diminutive right vertebral artery is not well visualized, and may be occluded or have slow flow. Distal right vertebral artery is visualize beyond the takeoff of the right posterior inferior cerebral artery. Posterior inferior cerebral arteries themselves are grossly patent. Basilar artery widely patent. Possible short segment high-grade stenosis within the proximal right superior cerebellar artery. Left superior cerebellar artery well opacified. Posterior cerebral arteries arise from the basilar artery bilaterally. Right posterior cerebral artery patent to its distal aspect. There  is attenuation and possible occlusion of the distal left PCA. Again, this is likely chronic in nature given the lack of infarct in this territory.  No aneurysm or vascular malformation.  IMPRESSION: MRI HEAD IMPRESSION:  1. Large acute right MCA territory infarct involving the right parieto-occipital and temporal lobes with additional infarct within the right caudate. There is associated petechial/cortical hemorrhage within the right parietal region. 2. Small remote lacunar infarcts involving the bilateral basal ganglia. 3. Moderate  age-related cerebral atrophy with mild chronic microvascular ischemic disease.  MRA HEAD IMPRESSION:  1. No acute large vessel or proximal branch occlusion identified within the intracranial circulation. 2. Attenuation of the distal left MCA branches with possible occlusion of a proximal left M2 branch. This is likely chronic in nature given the lack of acute infarct in this vascular territory. 3. Apparent occlusion of the mid left PCA. Again, this is felt to be chronic in nature given the lack of acute infarct in this vascular territory.  4. Poor visualization of the right vertebral artery, which may be due to slow flow versus occlusion proximally within the neck. The dominant left vertebral artery is widely patent as is the basilar artery.   Electronically Signed   By: Jeannine Boga M.D.   On: 04/23/2015 06:11   Dg Chest Port 1 View  04/10/2015   CLINICAL DATA:  Fever, altered mental status  EXAM: PORTABLE CHEST - 1 VIEW  COMPARISON:  11/13/2014  FINDINGS: There is no focal parenchymal opacity. There is no pleural effusion or pneumothorax. The heart and mediastinal contours are unremarkable. 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  IMPRESSION: No active disease.   Electronically Signed   By: Kathreen Devoid   On: 04/10/2015 14:14   Mr Jodene Nam Head/brain Wo Cm  04/23/2015   CLINICAL DATA:  Initial valuation for acute left-sided weakness, left facial droop, rightward gaze. Recent hospitalization for UTI and bacteremia.  EXAM: MRI HEAD WITHOUT CONTRAST  MRA HEAD WITHOUT CONTRAST  TECHNIQUE: Multiplanar, multiecho pulse sequences of the brain and surrounding structures were obtained without intravenous contrast. Angiographic images of the head were obtained using MRA technique without contrast.  COMPARISON:  Prior CT from 04/22/2015  FINDINGS: MRI HEAD FINDINGS  Diffuse prominence of the CSF containing spaces is compatible with generalized age-related  cerebral atrophy. Fairly mild chronic small vessel ischemic type changes present within the periventricular white matter. Chronic small vessel type changes present within the central pons as well. Probable small remote lacunar infarcts within the right basal ganglia. Tiny remote lacunar infarct within the left caudate as well.  There is confluent abnormal restricted diffusion involving the right parieto-occipital cortex, compatible with acute right MCA territory infarct (series 3, image 29). There is associated gyral swelling with edema within the area of infarction. Petechial/cortical hemorrhage present within the right parietal lobe on gradient echo sequence (series 7, image 16). Small serpiginous infarct within the mesial right temporal lobe as well (series 3, image 21). Additional small infarct involves the right caudate (series 3, image 29). These are also consistent with an MCA territory infarction. No significant mass effect or midline shift. Poor flow void within the distal right vertebral artery. Flow voids otherwise maintained.  No mass lesion, midline shift, or mass effect. No extra-axial fluid collection. Ventricular prominence noted, likely related to global parenchymal volume loss. No hydrocephalus.  Craniocervical junction within normal limits. Mild degenerative changes noted within the visualized upper cervical spine. Pituitary gland normal.  No acute abnormality about the orbits. Paranasal sinuses are clear. No mastoid effusion. Inner ear structures grossly normal.  Bone marrow signal intensity within normal limits. No scalp soft tissue abnormality.  MRA HEAD FINDINGS  ANTERIOR CIRCULATION:  Visualized distal cervical segments of the internal carotid arteries are widely patent with antegrade flow. The petrous and cavernous segments arm patent. There is moderate narrowing of the supra clinoid left ICA. Right supra clinoid segment widely patent. Right A1 segment widely patent. Left A1 segment slightly  hypoplastic but patent. Anterior communicating artery normal. Anterior cerebral arteries well opacified.  M1 segments patent bilaterally without high-grade stenosis or occlusion. MCA bifurcations within normal limits. No proximal or early branch occlusion identified on the right. On the left, there is attenuation of the distal left MCA branches with possible occlusion of a proximal left M2 branch at the base of the sylvian fissure (series 601, image 9). This is most certainly chronic in nature given the lack of acute infarct in this cerebral hemisphere.  POSTERIOR CIRCULATION:  Left vertebral artery is dominant and widely patent to the vertebrobasilar junction. The diminutive right vertebral artery is not well visualized, and may be occluded or have slow flow. Distal right vertebral artery is visualize beyond the takeoff of the right posterior inferior cerebral artery. Posterior inferior cerebral arteries themselves are grossly patent. Basilar artery widely patent. Possible short segment high-grade stenosis within the proximal right superior cerebellar artery. Left superior cerebellar artery well opacified. Posterior cerebral arteries arise from the basilar artery bilaterally. Right posterior cerebral artery patent to its distal aspect. There is attenuation and possible occlusion of the distal left PCA. Again, this is likely chronic in nature given the lack of infarct in this territory.  No aneurysm or vascular malformation.  IMPRESSION: MRI HEAD IMPRESSION:  1. Large acute right MCA territory infarct involving the right parieto-occipital and temporal lobes with additional infarct within the right caudate. There is associated petechial/cortical hemorrhage within the right parietal region. 2. Small remote lacunar infarcts involving the bilateral basal ganglia. 3. Moderate age-related cerebral atrophy with mild chronic microvascular ischemic disease.  MRA HEAD IMPRESSION:  1. No acute large vessel or proximal branch  occlusion identified within the intracranial circulation. 2. Attenuation of the distal left MCA branches with possible occlusion of a proximal left M2 branch. This is likely chronic in nature given the lack of acute infarct in this vascular territory. 3. Apparent occlusion of the mid left PCA. Again, this is felt to be chronic in nature given the lack of acute infarct in this vascular territory.  4. Poor visualization of the right vertebral artery, which may be due to slow flow versus occlusion proximally within the neck. The dominant left vertebral artery is widely patent as is the basilar artery.   Electronically Signed   By: Jeannine Boga M.D.   On: 04/23/2015 06:11     CBC  Recent Labs Lab 04/22/15 1843 04/22/15 1853  WBC 9.5  --   HGB 12.4* 13.9  HCT 36.9* 41.0  PLT 353  --   MCV 87.6  --   MCH 29.5  --   MCHC 33.6  --   RDW 16.2*  --   LYMPHSABS 1.8  --   MONOABS 0.9  --   EOSABS 0.2  --   BASOSABS 0.0  --     Chemistries   Recent Labs Lab 04/22/15 1843 04/22/15 1853 04/23/15 0516  NA 132* 134* 132*  K 5.8* 5.7* 5.1  CL 104  103 106  CO2 22  --  19*  GLUCOSE 165* 165* 114*  BUN 19 23* 16  CREATININE 1.35* 1.30* 1.20  CALCIUM 8.9  --  8.7*  AST 42*  --   --   ALT 27  --   --   ALKPHOS 181*  --   --   BILITOT 0.4  --   --    ------------------------------------------------------------------------------------------------------------------ estimated creatinine clearance is 38.2 mL/min (by C-G formula based on Cr of 1.2). ------------------------------------------------------------------------------------------------------------------ No results for input(s): HGBA1C in the last 72 hours. ------------------------------------------------------------------------------------------------------------------  Recent Labs  04/23/15 0515  CHOL 148  HDL 31*  LDLCALC 98  TRIG 97  CHOLHDL 4.8    ------------------------------------------------------------------------------------------------------------------ No results for input(s): TSH, T4TOTAL, T3FREE, THYROIDAB in the last 72 hours.  Invalid input(s): FREET3 ------------------------------------------------------------------------------------------------------------------ No results for input(s): VITAMINB12, FOLATE, FERRITIN, TIBC, IRON, RETICCTPCT in the last 72 hours.  Coagulation profile  Recent Labs Lab 04/22/15 1843  INR 1.12    No results for input(s): DDIMER in the last 72 hours.  Cardiac Enzymes No results for input(s): CKMB, TROPONINI, MYOGLOBIN in the last 168 hours.  Invalid input(s): CK ------------------------------------------------------------------------------------------------------------------ Invalid input(s): POCBNP   CBG: No results for input(s): GLUCAP in the last 168 hours.     Studies: Ct Head Wo Contrast  04/22/2015   CLINICAL DATA:  81 year old male with left-sided weakness. Code stroke.  EXAM: CT HEAD WITHOUT CONTRAST  TECHNIQUE: Contiguous axial images were obtained from the base of the skull through the vertex without intravenous contrast.  COMPARISON:  No priors.  FINDINGS: Moderate cerebral atrophy with extensive ex vacuo dilatation of the ventricular system. Patchy and confluent areas of decreased attenuation are noted throughout the deep and periventricular white matter of the cerebral hemispheres bilaterally, compatible with chronic microvascular ischemic disease. Physiologic calcifications of the basal ganglia bilaterally. No acute intracranial abnormalities. Specifically, no evidence of acute intracranial hemorrhage, no definite findings of acute/subacute cerebral ischemia, no mass, mass effect, hydrocephalus or abnormal intra or extra-axial fluid collections. Visualized paranasal sinuses and mastoids are well pneumatized. No acute displaced skull fractures are identified.   IMPRESSION: 1. No acute intracranial abnormalities. 2. Moderate cerebral atrophy with chronic microvascular ischemic changes in the cerebral white matter, as above. I attempt to call these results to Dr. Doy Mince at 6:47 p.m., however, the page went unanswered. Accordingly, these results were called by telephone at the time of interpretation on 04/22/2015 at 6:58 pm to Dr. Davonna Belling, who verbally acknowledged these results.   Electronically Signed   By: Vinnie Langton M.D.   On: 04/22/2015 19:00   Mr Brain Wo Contrast  04/23/2015   CLINICAL DATA:  Initial valuation for acute left-sided weakness, left facial droop, rightward gaze. Recent hospitalization for UTI and bacteremia.  EXAM: MRI HEAD WITHOUT CONTRAST  MRA HEAD WITHOUT CONTRAST  TECHNIQUE: Multiplanar, multiecho pulse sequences of the brain and surrounding structures were obtained without intravenous contrast. Angiographic images of the head were obtained using MRA technique without contrast.  COMPARISON:  Prior CT from 04/22/2015  FINDINGS: MRI HEAD FINDINGS  Diffuse prominence of the CSF containing spaces is compatible with generalized age-related cerebral atrophy. Fairly mild chronic small vessel ischemic type changes present within the periventricular white matter. Chronic small vessel type changes present within the central pons as well. Probable small remote lacunar infarcts within the right basal ganglia. Tiny remote lacunar infarct within the left caudate as well.  There is confluent abnormal restricted diffusion involving the right parieto-occipital cortex, compatible  with acute right MCA territory infarct (series 3, image 29). There is associated gyral swelling with edema within the area of infarction. Petechial/cortical hemorrhage present within the right parietal lobe on gradient echo sequence (series 7, image 16). Small serpiginous infarct within the mesial right temporal lobe as well (series 3, image 21). Additional small infarct  involves the right caudate (series 3, image 29). These are also consistent with an MCA territory infarction. No significant mass effect or midline shift. Poor flow void within the distal right vertebral artery. Flow voids otherwise maintained.  No mass lesion, midline shift, or mass effect. No extra-axial fluid collection. Ventricular prominence noted, likely related to global parenchymal volume loss. No hydrocephalus.  Craniocervical junction within normal limits. Mild degenerative changes noted within the visualized upper cervical spine. Pituitary gland normal.  No acute abnormality about the orbits. Paranasal sinuses are clear. No mastoid effusion. Inner ear structures grossly normal.  Bone marrow signal intensity within normal limits. No scalp soft tissue abnormality.  MRA HEAD FINDINGS  ANTERIOR CIRCULATION:  Visualized distal cervical segments of the internal carotid arteries are widely patent with antegrade flow. The petrous and cavernous segments arm patent. There is moderate narrowing of the supra clinoid left ICA. Right supra clinoid segment widely patent. Right A1 segment widely patent. Left A1 segment slightly hypoplastic but patent. Anterior communicating artery normal. Anterior cerebral arteries well opacified.  M1 segments patent bilaterally without high-grade stenosis or occlusion. MCA bifurcations within normal limits. No proximal or early branch occlusion identified on the right. On the left, there is attenuation of the distal left MCA branches with possible occlusion of a proximal left M2 branch at the base of the sylvian fissure (series 601, image 9). This is most certainly chronic in nature given the lack of acute infarct in this cerebral hemisphere.  POSTERIOR CIRCULATION:  Left vertebral artery is dominant and widely patent to the vertebrobasilar junction. The diminutive right vertebral artery is not well visualized, and may be occluded or have slow flow. Distal right vertebral artery is  visualize beyond the takeoff of the right posterior inferior cerebral artery. Posterior inferior cerebral arteries themselves are grossly patent. Basilar artery widely patent. Possible short segment high-grade stenosis within the proximal right superior cerebellar artery. Left superior cerebellar artery well opacified. Posterior cerebral arteries arise from the basilar artery bilaterally. Right posterior cerebral artery patent to its distal aspect. There is attenuation and possible occlusion of the distal left PCA. Again, this is likely chronic in nature given the lack of infarct in this territory.  No aneurysm or vascular malformation.  IMPRESSION: MRI HEAD IMPRESSION:  1. Large acute right MCA territory infarct involving the right parieto-occipital and temporal lobes with additional infarct within the right caudate. There is associated petechial/cortical hemorrhage within the right parietal region. 2. Small remote lacunar infarcts involving the bilateral basal ganglia. 3. Moderate age-related cerebral atrophy with mild chronic microvascular ischemic disease.  MRA HEAD IMPRESSION:  1. No acute large vessel or proximal branch occlusion identified within the intracranial circulation. 2. Attenuation of the distal left MCA branches with possible occlusion of a proximal left M2 branch. This is likely chronic in nature given the lack of acute infarct in this vascular territory. 3. Apparent occlusion of the mid left PCA. Again, this is felt to be chronic in nature given the lack of acute infarct in this vascular territory.  4. Poor visualization of the right vertebral artery, which may be due to slow flow versus occlusion proximally within the neck.  The dominant left vertebral artery is widely patent as is the basilar artery.   Electronically Signed   By: Jeannine Boga M.D.   On: 04/23/2015 06:11   Mr Jodene Nam Head/brain Wo Cm  04/23/2015   CLINICAL DATA:  Initial valuation for acute left-sided weakness, left facial  droop, rightward gaze. Recent hospitalization for UTI and bacteremia.  EXAM: MRI HEAD WITHOUT CONTRAST  MRA HEAD WITHOUT CONTRAST  TECHNIQUE: Multiplanar, multiecho pulse sequences of the brain and surrounding structures were obtained without intravenous contrast. Angiographic images of the head were obtained using MRA technique without contrast.  COMPARISON:  Prior CT from 04/22/2015  FINDINGS: MRI HEAD FINDINGS  Diffuse prominence of the CSF containing spaces is compatible with generalized age-related cerebral atrophy. Fairly mild chronic small vessel ischemic type changes present within the periventricular white matter. Chronic small vessel type changes present within the central pons as well. Probable small remote lacunar infarcts within the right basal ganglia. Tiny remote lacunar infarct within the left caudate as well.  There is confluent abnormal restricted diffusion involving the right parieto-occipital cortex, compatible with acute right MCA territory infarct (series 3, image 29). There is associated gyral swelling with edema within the area of infarction. Petechial/cortical hemorrhage present within the right parietal lobe on gradient echo sequence (series 7, image 16). Small serpiginous infarct within the mesial right temporal lobe as well (series 3, image 21). Additional small infarct involves the right caudate (series 3, image 29). These are also consistent with an MCA territory infarction. No significant mass effect or midline shift. Poor flow void within the distal right vertebral artery. Flow voids otherwise maintained.  No mass lesion, midline shift, or mass effect. No extra-axial fluid collection. Ventricular prominence noted, likely related to global parenchymal volume loss. No hydrocephalus.  Craniocervical junction within normal limits. Mild degenerative changes noted within the visualized upper cervical spine. Pituitary gland normal.  No acute abnormality about the orbits. Paranasal sinuses  are clear. No mastoid effusion. Inner ear structures grossly normal.  Bone marrow signal intensity within normal limits. No scalp soft tissue abnormality.  MRA HEAD FINDINGS  ANTERIOR CIRCULATION:  Visualized distal cervical segments of the internal carotid arteries are widely patent with antegrade flow. The petrous and cavernous segments arm patent. There is moderate narrowing of the supra clinoid left ICA. Right supra clinoid segment widely patent. Right A1 segment widely patent. Left A1 segment slightly hypoplastic but patent. Anterior communicating artery normal. Anterior cerebral arteries well opacified.  M1 segments patent bilaterally without high-grade stenosis or occlusion. MCA bifurcations within normal limits. No proximal or early branch occlusion identified on the right. On the left, there is attenuation of the distal left MCA branches with possible occlusion of a proximal left M2 branch at the base of the sylvian fissure (series 601, image 9). This is most certainly chronic in nature given the lack of acute infarct in this cerebral hemisphere.  POSTERIOR CIRCULATION:  Left vertebral artery is dominant and widely patent to the vertebrobasilar junction. The diminutive right vertebral artery is not well visualized, and may be occluded or have slow flow. Distal right vertebral artery is visualize beyond the takeoff of the right posterior inferior cerebral artery. Posterior inferior cerebral arteries themselves are grossly patent. Basilar artery widely patent. Possible short segment high-grade stenosis within the proximal right superior cerebellar artery. Left superior cerebellar artery well opacified. Posterior cerebral arteries arise from the basilar artery bilaterally. Right posterior cerebral artery patent to its distal aspect. There is attenuation and possible  occlusion of the distal left PCA. Again, this is likely chronic in nature given the lack of infarct in this territory.  No aneurysm or vascular  malformation.  IMPRESSION: MRI HEAD IMPRESSION:  1. Large acute right MCA territory infarct involving the right parieto-occipital and temporal lobes with additional infarct within the right caudate. There is associated petechial/cortical hemorrhage within the right parietal region. 2. Small remote lacunar infarcts involving the bilateral basal ganglia. 3. Moderate age-related cerebral atrophy with mild chronic microvascular ischemic disease.  MRA HEAD IMPRESSION:  1. No acute large vessel or proximal branch occlusion identified within the intracranial circulation. 2. Attenuation of the distal left MCA branches with possible occlusion of a proximal left M2 branch. This is likely chronic in nature given the lack of acute infarct in this vascular territory. 3. Apparent occlusion of the mid left PCA. Again, this is felt to be chronic in nature given the lack of acute infarct in this vascular territory.  4. Poor visualization of the right vertebral artery, which may be due to slow flow versus occlusion proximally within the neck. The dominant left vertebral artery is widely patent as is the basilar artery.   Electronically Signed   By: Jeannine Boga M.D.   On: 04/23/2015 06:11      No results found for: HGBA1C Lab Results  Component Value Date   LDLCALC 98 04/23/2015   CREATININE 1.20 04/23/2015       Scheduled Meds: . aspirin  300 mg Rectal Daily   Or  . aspirin  325 mg Oral Daily  . feeding supplement (ENSURE ENLIVE)  237 mL Oral BID BM  . feeding supplement (PRO-STAT SUGAR FREE 64)  30 mL Oral Daily  . heparin  5,000 Units Subcutaneous 3 times per day   Continuous Infusions: . sodium chloride 75 mL/hr at 04/24/15 0740    Principal Problem:   Stroke Active Problems:   Essential hypertension   Aortic valve stenosis   Bacteremia due to Klebsiella pneumoniae   Hyperkalemia   A-fib    Time spent: 35 minutes   [facility]  [email or web address], password  [facility] 04/24/2015, 11:07 AM  LOS: 2 days

## 2015-04-24 NOTE — Progress Notes (Signed)
OT Cancellation Note  Patient Details Name: [name] MRN: [id number] DOB: 1926-05-16   Cancelled Treatment:    Reason Eval/Treat Not Completed: Patient not medically ready (currently vomiting ) Ot notes acceptance to SNF at [facility] by SW notes  Parke Poisson B 04/24/2015, 11:12 AM

## 2015-04-25 ENCOUNTER — Encounter
Admission: RE | Admit: 2015-04-25 | Discharge: 2015-04-25 | Disposition: A | Discharge status: Home / Self Care | Payer: Medicare Other | Source: Ambulatory Visit | Attending: Internal Medicine | Admitting: Internal Medicine

## 2015-04-25 DIAGNOSIS — R112 Nausea with vomiting, unspecified: Secondary | ICD-10-CM

## 2015-04-25 LAB — BASIC METABOLIC PANEL
ANION GAP: 7 (ref 5–15)
BUN: 17 mg/dL (ref 6–20)
CALCIUM: 8.2 mg/dL — AB (ref 8.9–10.3)
CO2: 20 mmol/L — ABNORMAL LOW (ref 22–32)
Chloride: 103 mmol/L (ref 101–111)
Creatinine, Ser: 1 mg/dL (ref 0.61–1.24)
GFR calc Af Amer: >60 mL/min (ref >60)
GFR calc non Af Amer: >60 mL/min (ref >60)
GLUCOSE: 103 mg/dL — AB (ref 65–99)
Potassium: 4.5 mmol/L (ref 3.5–5.1)
Sodium: 130 mmol/L — ABNORMAL LOW (ref 135–145)

## 2015-04-25 MED ORDER — PRO-STAT SUGAR FREE PO LIQD: 30.0000 mL | Freq: Every day | ORAL | Status: AC

## 2015-04-25 MED ORDER — ENSURE ENLIVE PO LIQD: 237.0000 mL | Freq: Three times a day (TID) | ORAL | Status: AC

## 2015-04-25 MED ORDER — ONDANSETRON HCL 4 MG PO TABS: 4.0000 mg | ORAL_TABLET | Freq: Three times a day (TID) | ORAL | Status: AC | PRN

## 2015-04-25 MED ORDER — ASPIRIN 325 MG PO TABS: 325.0000 mg | ORAL_TABLET | Freq: Every day | ORAL | Status: AC

## 2015-04-25 NOTE — Discharge Summary (Signed)
Physician Discharge Summary  BASCOM BIEL DVV:[id number] DOB: [birth year] DOA: 04/22/2015  PCP: Idelle Crouch, MD  Admit date: 04/22/2015 Discharge date: 04/25/2015  Time spent: greater than 30 minutes  Recommendations for Outpatient Follow-up:   To SNF for CVA rehab  If patient continues to decline, recommend discussing transitioning to comfort care  Continue PT, OT, ST  Discharge Diagnoses:  Principal Problem:   Stroke Active Problems:   Essential hypertension   Aortic valve stenosis   Bacteremia due to Klebsiella pneumoniae   Hyperkalemia   A-fib   Nausea with vomiting   Discharge Condition: Stable  Diet recommendation: Dys. 3 diet w/ thin liquids with strict aspiration/reflux precautions. Medicine in puree as pt had difficulty orally coordinating boluses. Pt has chronic multifactorial dysphagia with acute exacerbation due to current CVA.   Code status DNR  Filed Weights   04/22/15 2334  Weight: 62.279 kg (137 lb 4.8 oz)    History of present illness:  81 y.o. male who was recently transferred to SNF. Today had a good therapy session. Took a nap afterward. When he awakened was noted by his daughter to be starring off in to space. Facial droop was noted. Speech was slurred. EMS was called at that time and the patient was brought in as a code stroke. Initial NIHSS of 18. did not receive IV t-PA due to delay in arrival.   Hospital Course:  Stroke: Non-dominant large right MCA and small R caudate infarct embolic secondary to recently diagnosed atrial fibrillation   Resultant Left hemiparesis  MRI Large right MCA infarct (right parieto-occipital and temporal lobes) and right caudate infarct. petechial/cortical hemorrhage within the right parietal region  MRA No large vessel occlusion. L M2 branch occlusion. Mid L PCA occlusion.  Carotid Doppler 1-39% ICA stenosis. Vertebral artery flow is antegrade.   2D Echo Left ventricle: Posterior basal  akinesis. The cavity size was moderately dilated. Wall thickness was increased in a pattern of severe LVH. There was severe concentric hypertrophy. Systolic function was normal. The estimated ejection fraction was in the range of 50% to 55%.LDL 98  HgbA1c 6.7  Atrial Fibrillation  apparently a new dx during recent hospitalization at [facility] anticoagulation: none  CHA2DS2-VASc Score = 8, ?2 oral anticoagulation recommended Age in Years: ?36 +2  Sex: Male 2  Hypertension History: yes +1   Diabetes Mellitus: yes +1  Congestive Heart Failure History: yes +1 Vascular Disease History: yes +1  Stroke/TIA/Thromboembolism History: yes +2  Dr. Leonie Man discussed anticoagulation options with daughter. Given pts frail, unsteady state and generalized overall poor medical condidtion, decision made not to place pt on anticoagulation. Will continue aspirin at discharge  Hypertension  Stable  Hyperlipidemia  Home meds: No statin  LDL 98, goal < 70  Given frail state and pt/family desire for quality of quantity, will not add statin at this time  Diabetes  HgbA1c 6.7   Recent Bacteremia d/t K Pneumo. Completed course of antibiotic during hospitalization   Hyperkalemia likely secondary to Bactrim. Resolved.  Had an episode of vomiting which has resolved.  Patient had previously been on hospice but was transferred to skilled nursing facility. Had been improving per family until he suffered from a stroke. Family would like to discharge back to skilled nursing facility for an attempt at rehabilitation but understand that should he continue to decline, discussions about comfort measures would be in order.  Procedures:  none  Consultations:  neurology  Discharge Exam: Filed Vitals:   04/25/15 1001  BP: 140/73  Pulse: 82  Temp:  99.1 F (37.3 C)  Resp: 20    General: alert. Comfortable. Appropriate. Cardiovascular: regular rate rhythm without murmurs gallops rubs Respiratory: clear to auscultation bilaterally without wheezes rhonchi or rales Neurologic: Left hemi-neglect. Left arm strength 0 over 5. Left leg strength 4 out of 5. Right-sided strength normal.  Discharge Instructions   Discharge Instructions    Walk with assistance    Complete by:  As directed           Current Discharge Medication List    START taking these medications   Details  Amino Acids-Protein Hydrolys (FEEDING SUPPLEMENT, PRO-STAT SUGAR FREE 64,) LIQD Take 30 mLs by mouth daily. Qty: 900 mL, Refills: 0    aspirin 325 MG tablet Take 1 tablet (325 mg total) by mouth daily.    feeding supplement, ENSURE ENLIVE, (ENSURE ENLIVE) LIQD Take 237 mLs by mouth 3 (three) times daily between meals. Qty: 237 mL, Refills: 12    ondansetron (ZOFRAN) 4 MG tablet Take 1 tablet (4 mg total) by mouth every 8 (eight) hours as needed for nausea or vomiting. Qty: 20 tablet, Refills: 0      CONTINUE these medications which have NOT CHANGED   Details  allopurinol (ZYLOPRIM) 100 MG tablet Take 100 mg by mouth once a week. Wednesdays    carvedilol (COREG) 12.5 MG tablet Take 12.5 mg by mouth 2 (two) times daily.    Wound Dressings (HYDROGEL EX) Apply 1 application topically every 3 (three) days.      STOP taking these medications     sulfamethoxazole-trimethoprim (BACTRIM DS,SEPTRA DS) 800-160 MG per tablet        Allergies  Allergen Reactions  . Iodinated Diagnostic Agents Other (See Comments)    Reaction:  Unknown   . Penicillins Other (See Comments)    Reaction:  Unknown   Follow-up Information    Follow up with SPARKS,JEFFREY D, MD.   Specialty:  Internal Medicine   Contact information:   [place] [address] [phone]        The results of significant diagnostics from this hospitalization (including  imaging, microbiology, ancillary and laboratory) are listed below for reference.    Significant Diagnostic Studies: Ct Head Wo Contrast  04/22/2015   CLINICAL DATA:  81 year old male with left-sided weakness. Code stroke.  EXAM: CT HEAD WITHOUT CONTRAST  TECHNIQUE: Contiguous axial images were obtained from the base of the skull through the vertex without intravenous contrast.  COMPARISON:  No priors.  FINDINGS: Moderate cerebral atrophy with extensive ex vacuo dilatation of the ventricular system. Patchy and confluent areas of decreased attenuation are noted throughout the deep and periventricular white matter of the cerebral hemispheres bilaterally, compatible with chronic microvascular ischemic disease. Physiologic calcifications of the basal ganglia bilaterally. No acute intracranial abnormalities. Specifically, no evidence of acute intracranial hemorrhage, no definite findings of acute/subacute cerebral ischemia, no mass, mass effect, hydrocephalus or abnormal intra or extra-axial fluid collections. Visualized paranasal sinuses and mastoids are well pneumatized. No acute displaced skull fractures are identified.  IMPRESSION: 1. No acute intracranial abnormalities. 2. Moderate cerebral atrophy with chronic microvascular ischemic changes in the cerebral white matter, as above. I attempt to call these results to Dr. Doy Mince at 6:47 p.m., however, the page went unanswered. Accordingly, these results were called by telephone at the time of interpretation on 04/22/2015 at 6:58 pm to Dr. Davonna Belling, who verbally acknowledged these results.   Electronically Signed   By:  Vinnie Langton M.D.   On: 04/22/2015 19:00   Mr Brain Wo Contrast  04/23/2015   CLINICAL DATA:  Initial valuation for acute left-sided weakness, left facial droop, rightward gaze. Recent hospitalization for UTI and bacteremia.  EXAM: MRI HEAD WITHOUT CONTRAST  MRA HEAD WITHOUT CONTRAST  TECHNIQUE: Multiplanar, multiecho pulse sequences of  the brain and surrounding structures were obtained without intravenous contrast. Angiographic images of the head were obtained using MRA technique without contrast.  COMPARISON:  Prior CT from 04/22/2015  FINDINGS: MRI HEAD FINDINGS  Diffuse prominence of the CSF containing spaces is compatible with generalized age-related cerebral atrophy. Fairly mild chronic small vessel ischemic type changes present within the periventricular white matter. Chronic small vessel type changes present within the central pons as well. Probable small remote lacunar infarcts within the right basal ganglia. Tiny remote lacunar infarct within the left caudate as well.  There is confluent abnormal restricted diffusion involving the right parieto-occipital cortex, compatible with acute right MCA territory infarct (series 3, image 29). There is associated gyral swelling with edema within the area of infarction. Petechial/cortical hemorrhage present within the right parietal lobe on gradient echo sequence (series 7, image 16). Small serpiginous infarct within the mesial right temporal lobe as well (series 3, image 21). Additional small infarct involves the right caudate (series 3, image 29). These are also consistent with an MCA territory infarction. No significant mass effect or midline shift. Poor flow void within the distal right vertebral artery. Flow voids otherwise maintained.  No mass lesion, midline shift, or mass effect. No extra-axial fluid collection. Ventricular prominence noted, likely related to global parenchymal volume loss. No hydrocephalus.  Craniocervical junction within normal limits. Mild degenerative changes noted within the visualized upper cervical spine. Pituitary gland normal.  No acute abnormality about the orbits. Paranasal sinuses are clear. No mastoid effusion. Inner ear structures grossly normal.  Bone marrow signal intensity within normal limits. No scalp soft tissue abnormality.  MRA HEAD FINDINGS  ANTERIOR  CIRCULATION:  Visualized distal cervical segments of the internal carotid arteries are widely patent with antegrade flow. The petrous and cavernous segments arm patent. There is moderate narrowing of the supra clinoid left ICA. Right supra clinoid segment widely patent. Right A1 segment widely patent. Left A1 segment slightly hypoplastic but patent. Anterior communicating artery normal. Anterior cerebral arteries well opacified.  M1 segments patent bilaterally without high-grade stenosis or occlusion. MCA bifurcations within normal limits. No proximal or early branch occlusion identified on the right. On the left, there is attenuation of the distal left MCA branches with possible occlusion of a proximal left M2 branch at the base of the sylvian fissure (series 601, image 9). This is most certainly chronic in nature given the lack of acute infarct in this cerebral hemisphere.  POSTERIOR CIRCULATION:  Left vertebral artery is dominant and widely patent to the vertebrobasilar junction. The diminutive right vertebral artery is not well visualized, and may be occluded or have slow flow. Distal right vertebral artery is visualize beyond the takeoff of the right posterior inferior cerebral artery. Posterior inferior cerebral arteries themselves are grossly patent. Basilar artery widely patent. Possible short segment high-grade stenosis within the proximal right superior cerebellar artery. Left superior cerebellar artery well opacified. Posterior cerebral arteries arise from the basilar artery bilaterally. Right posterior cerebral artery patent to its distal aspect. There is attenuation and possible occlusion of the distal left PCA. Again, this is likely chronic in nature given the lack of infarct in this territory.  No aneurysm or vascular malformation.  IMPRESSION: MRI HEAD IMPRESSION:  1. Large acute right MCA territory infarct involving the right parieto-occipital and temporal lobes with additional infarct within the  right caudate. There is associated petechial/cortical hemorrhage within the right parietal region. 2. Small remote lacunar infarcts involving the bilateral basal ganglia. 3. Moderate age-related cerebral atrophy with mild chronic microvascular ischemic disease.  MRA HEAD IMPRESSION:  1. No acute large vessel or proximal branch occlusion identified within the intracranial circulation. 2. Attenuation of the distal left MCA branches with possible occlusion of a proximal left M2 branch. This is likely chronic in nature given the lack of acute infarct in this vascular territory. 3. Apparent occlusion of the mid left PCA. Again, this is felt to be chronic in nature given the lack of acute infarct in this vascular territory.  4. Poor visualization of the right vertebral artery, which may be due to slow flow versus occlusion proximally within the neck. The dominant left vertebral artery is widely patent as is the basilar artery.   Electronically Signed   By: Jeannine Boga M.D.   On: 04/23/2015 06:11   Dg Chest Port 1 View  04/10/2015   CLINICAL DATA:  Fever, altered mental status  EXAM: PORTABLE CHEST - 1 VIEW  COMPARISON:  11/13/2014  FINDINGS: There is no focal parenchymal opacity. There is no pleural effusion or pneumothorax. The heart and mediastinal contours are unremarkable. 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  IMPRESSION: No active disease.   Electronically Signed   By: Kathreen Devoid   On: 04/10/2015 14:14   Mr Jodene Nam Head/brain Wo Cm  04/23/2015   CLINICAL DATA:  Initial valuation for acute left-sided weakness, left facial droop, rightward gaze. Recent hospitalization for UTI and bacteremia.  EXAM: MRI HEAD WITHOUT CONTRAST  MRA HEAD WITHOUT CONTRAST  TECHNIQUE: Multiplanar, multiecho pulse sequences of the brain and surrounding structures were obtained without intravenous contrast. Angiographic images of the head were obtained using MRA technique  without contrast.  COMPARISON:  Prior CT from 04/22/2015  FINDINGS: MRI HEAD FINDINGS  Diffuse prominence of the CSF containing spaces is compatible with generalized age-related cerebral atrophy. Fairly mild chronic small vessel ischemic type changes present within the periventricular white matter. Chronic small vessel type changes present within the central pons as well. Probable small remote lacunar infarcts within the right basal ganglia. Tiny remote lacunar infarct within the left caudate as well.  There is confluent abnormal restricted diffusion involving the right parieto-occipital cortex, compatible with acute right MCA territory infarct (series 3, image 29). There is associated gyral swelling with edema within the area of infarction. Petechial/cortical hemorrhage present within the right parietal lobe on gradient echo sequence (series 7, image 16). Small serpiginous infarct within the mesial right temporal lobe as well (series 3, image 21). Additional small infarct involves the right caudate (series 3, image 29). These are also consistent with an MCA territory infarction. No significant mass effect or midline shift. Poor flow void within the distal right vertebral artery. Flow voids otherwise maintained.  No mass lesion, midline shift, or mass effect. No extra-axial fluid collection. Ventricular prominence noted, likely related to global parenchymal volume loss. No hydrocephalus.  Craniocervical junction within normal limits. Mild degenerative changes noted within the visualized upper cervical spine. Pituitary gland normal.  No acute abnormality about the orbits. Paranasal sinuses are clear. No mastoid effusion. Inner ear structures grossly normal.  Bone marrow signal intensity within normal  limits. No scalp soft tissue abnormality.  MRA HEAD FINDINGS  ANTERIOR CIRCULATION:  Visualized distal cervical segments of the internal carotid arteries are widely patent with antegrade flow. The petrous and cavernous  segments arm patent. There is moderate narrowing of the supra clinoid left ICA. Right supra clinoid segment widely patent. Right A1 segment widely patent. Left A1 segment slightly hypoplastic but patent. Anterior communicating artery normal. Anterior cerebral arteries well opacified.  M1 segments patent bilaterally without high-grade stenosis or occlusion. MCA bifurcations within normal limits. No proximal or early branch occlusion identified on the right. On the left, there is attenuation of the distal left MCA branches with possible occlusion of a proximal left M2 branch at the base of the sylvian fissure (series 601, image 9). This is most certainly chronic in nature given the lack of acute infarct in this cerebral hemisphere.  POSTERIOR CIRCULATION:  Left vertebral artery is dominant and widely patent to the vertebrobasilar junction. The diminutive right vertebral artery is not well visualized, and may be occluded or have slow flow. Distal right vertebral artery is visualize beyond the takeoff of the right posterior inferior cerebral artery. Posterior inferior cerebral arteries themselves are grossly patent. Basilar artery widely patent. Possible short segment high-grade stenosis within the proximal right superior cerebellar artery. Left superior cerebellar artery well opacified. Posterior cerebral arteries arise from the basilar artery bilaterally. Right posterior cerebral artery patent to its distal aspect. There is attenuation and possible occlusion of the distal left PCA. Again, this is likely chronic in nature given the lack of infarct in this territory.  No aneurysm or vascular malformation.  IMPRESSION: MRI HEAD IMPRESSION:  1. Large acute right MCA territory infarct involving the right parieto-occipital and temporal lobes with additional infarct within the right caudate. There is associated petechial/cortical hemorrhage within the right parietal region. 2. Small remote lacunar infarcts involving the  bilateral basal ganglia. 3. Moderate age-related cerebral atrophy with mild chronic microvascular ischemic disease.  MRA HEAD IMPRESSION:  1. No acute large vessel or proximal branch occlusion identified within the intracranial circulation. 2. Attenuation of the distal left MCA branches with possible occlusion of a proximal left M2 branch. This is likely chronic in nature given the lack of acute infarct in this vascular territory. 3. Apparent occlusion of the mid left PCA. Again, this is felt to be chronic in nature given the lack of acute infarct in this vascular territory.  4. Poor visualization of the right vertebral artery, which may be due to slow flow versus occlusion proximally within the neck. The dominant left vertebral artery is widely patent as is the basilar artery.   Electronically Signed   By: Jeannine Boga M.D.   On: 04/23/2015 06:11    Microbiology: No results found for this or any previous visit (from the past 240 hour(s)).   Labs: Basic Metabolic Panel:  Recent Labs Lab 04/22/15 1843 04/22/15 1853 04/23/15 0516 04/25/15 0505  NA 132* 134* 132* 130*  K 5.8* 5.7* 5.1 4.5  CL 104 103 106 103  CO2 22  --  19* 20*  GLUCOSE 165* 165* 114* 103*  BUN 19 23* 16 17  CREATININE 1.35* 1.30* 1.20 1.00  CALCIUM 8.9  --  8.7* 8.2*   Liver Function Tests:  Recent Labs Lab 04/22/15 1843  AST 42*  ALT 27  ALKPHOS 181*  BILITOT 0.4  PROT 5.9*  ALBUMIN 2.5*   No results for input(s): LIPASE, AMYLASE in the last 168 hours. No results for input(s):  AMMONIA in the last 168 hours. CBC:  Recent Labs Lab 04/22/15 1843 04/22/15 1853  WBC 9.5  --   NEUTROABS 6.6  --   HGB 12.4* 13.9  HCT 36.9* 41.0  MCV 87.6  --   PLT 353  --    Cardiac Enzymes: No results for input(s): CKTOTAL, CKMB, CKMBINDEX, TROPONINI in the last 168 hours. BNP: BNP (last 3 results) No results for input(s): BNP in the last 8760 hours.  ProBNP (last 3 results) No results for input(s): PROBNP  in the last 8760 hours.  CBG: No results for input(s): GLUCAP in the last 168 hours.     SignedDelfina Redwood  [facility] 04/25/2015, 10:07 AM

## 2015-04-25 NOTE — Clinical Social Work Placement (Signed)
   CLINICAL SOCIAL WORK PLACEMENT  NOTE  Date:  04/25/2015  Patient Details  Name: [name] MRN: [id number] Date of Birth: [birth year]  Clinical Social Work is seeking post-discharge placement for this patient at the [place] level of care (*CSW will initial, date and re-position this form in  chart as items are completed):  Yes   Patient/family provided with [facility] Work Department's list of facilities offering this level of care within the geographic area requested by the patient (or if unable, by the patient's family).  Yes   Patient/family informed of their freedom to choose among providers that offer the needed level of care, that participate in Medicare, Medicaid or managed care program needed by the patient, have an available bed and are willing to accept the patient.  Yes   Patient/family informed of [facility]'s ownership interest in [facility] and [facility], as well as of the fact that they are under no obligation to receive care at these facilities.  PASRR submitted to EDS on       PASRR number received on       Existing PASRR number confirmed on 04/23/15     FL2 transmitted to all facilities in geographic area requested by pt/family on 04/23/15     FL2 transmitted to all facilities within larger geographic area on       Patient informed that his/her managed care company has contracts with or will negotiate with certain facilities, including the following:        Yes   Patient/family informed of bed offers received.  Patient chooses bed at [facility]     Physician recommends and patient chooses bed at      Patient to be [facility] on 04/25/15.  Patient to be transferred to facility by [facility]      Patient family notified on 04/25/15 of transfer.  Name of family member notified:  [name], daughter      PHYSICIAN       Additional Comment:     _______________________________________________ Greta Doom, LCSW 04/25/2015, 12:06 PM

## 2015-05-13 DEATH — deceased

## 2016-02-04 IMAGING — CR DG CHEST 1V PORT
1 series · 1 of 1 positions shown · non-contrast
Comparison: 11/13/2014

CLINICAL DATA: Fever, altered mental status

EXAM:
PORTABLE CHEST - 1 VIEW

[ap]
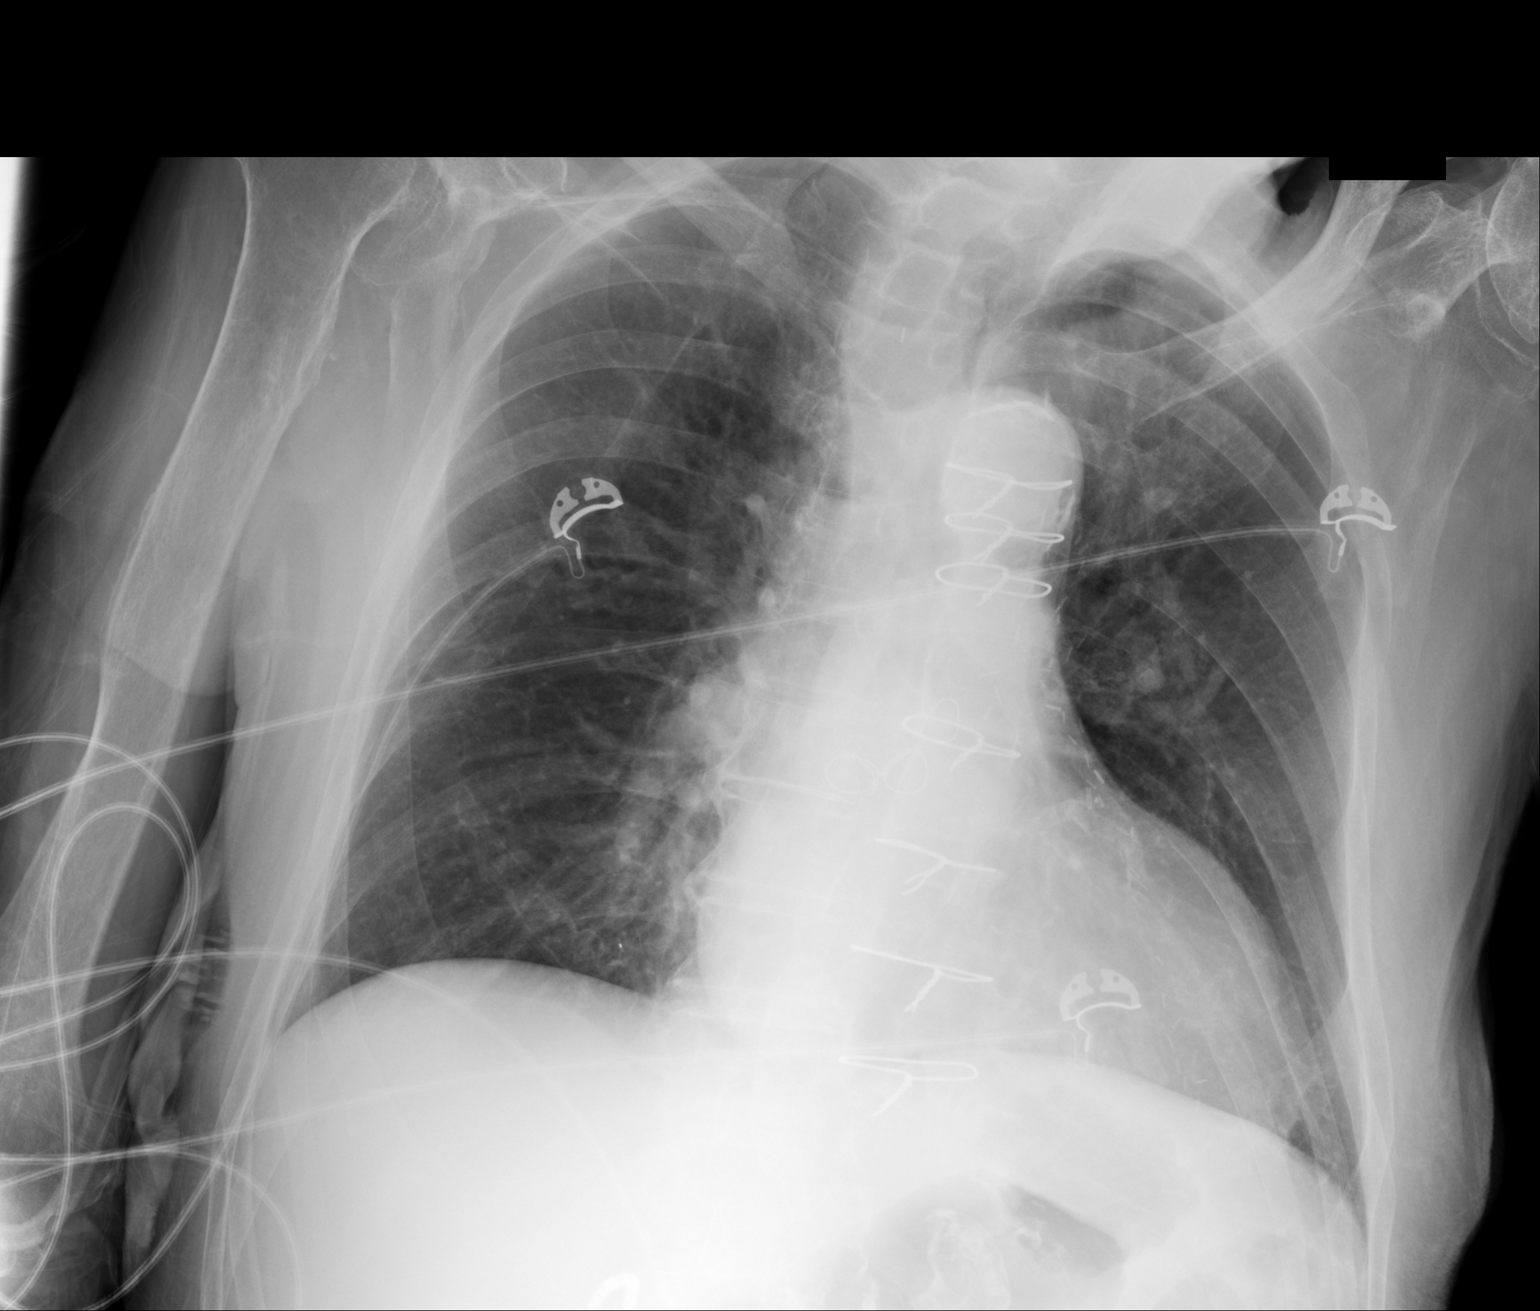

[1 of 1 positions shown; findings below may reference images not displayed]

FINDINGS: There is no focal parenchymal opacity. There is no pleural effusion
or pneumothorax. The heart and mediastinal contours are
unremarkable. There is evidence of prior CABG.

There is mild osteoarthritis bilateral glenohumeral joints.
IMPRESSION: No active disease.
